# Patient Record
Sex: Male | Born: 1960
Health system: Southern US, Community
[De-identification: ages and names within clinical notes are randomized; demographics above are authoritative.]

## PROBLEM LIST (undated history)

## (undated) DIAGNOSIS — R42 Dizziness and giddiness: Secondary | ICD-10-CM

## (undated) DIAGNOSIS — E119 Type 2 diabetes mellitus without complications: Secondary | ICD-10-CM

## (undated) DIAGNOSIS — E785 Hyperlipidemia, unspecified: Secondary | ICD-10-CM

## (undated) DIAGNOSIS — I10 Essential (primary) hypertension: Secondary | ICD-10-CM

## (undated) HISTORY — DX: Hyperlipidemia, unspecified: E78.5

## (undated) HISTORY — DX: Dizziness and giddiness: R42

## (undated) HISTORY — PX: VASECTOMY: SHX75

## (undated) HISTORY — DX: Type 2 diabetes mellitus without complications: E11.9

## (undated) HISTORY — DX: Essential (primary) hypertension: I10

## (undated) HISTORY — PX: CYST REMOVAL LEG: SHX6280

## (undated) HISTORY — PX: COLONOSCOPY: SHX174

---

## 2008-10-11 ENCOUNTER — Emergency Department: Payer: Self-pay | Admitting: Emergency Medicine

## 2010-02-01 ENCOUNTER — Emergency Department: Payer: Self-pay | Admitting: Internal Medicine

## 2010-04-08 ENCOUNTER — Ambulatory Visit: Payer: Self-pay | Admitting: Gastroenterology

## 2013-12-01 ENCOUNTER — Emergency Department: Payer: Self-pay | Admitting: Emergency Medicine

## 2013-12-01 LAB — CBC WITH DIFFERENTIAL/PLATELET
BASOS PCT: 0.7 %
Basophil #: 0 10*3/uL (ref 0.0–0.1)
Eosinophil #: 0.1 10*3/uL (ref 0.0–0.7)
Eosinophil %: 2.1 %
HCT: 39.8 % — AB (ref 40.0–52.0)
HGB: 13.3 g/dL (ref 13.0–18.0)
Lymphocyte #: 1.9 10*3/uL (ref 1.0–3.6)
Lymphocyte %: 35 %
MCH: 30.3 pg (ref 26.0–34.0)
MCHC: 33.4 g/dL (ref 32.0–36.0)
MCV: 91 fL (ref 80–100)
MONO ABS: 0.3 x10 3/mm (ref 0.2–1.0)
Monocyte %: 6.1 %
Neutrophil #: 3.1 10*3/uL (ref 1.4–6.5)
Neutrophil %: 56.1 %
PLATELETS: 212 10*3/uL (ref 150–440)
RBC: 4.39 10*6/uL — ABNORMAL LOW (ref 4.40–5.90)
RDW: 14.3 % (ref 11.5–14.5)
WBC: 5.6 10*3/uL (ref 3.8–10.6)

## 2013-12-01 LAB — TROPONIN I: Troponin-I: <0.02 ng/mL

## 2013-12-01 LAB — URINALYSIS, COMPLETE
BACTERIA: NONE SEEN
BILIRUBIN, UR: NEGATIVE
Blood: NEGATIVE
Glucose,UR: NEGATIVE mg/dL (ref 0–75)
KETONE: NEGATIVE
Leukocyte Esterase: NEGATIVE
Nitrite: NEGATIVE
PROTEIN: NEGATIVE
Ph: 5 (ref 4.5–8.0)
RBC, UR: NONE SEEN /HPF (ref 0–5)
SQUAMOUS EPITHELIAL: NONE SEEN
Specific Gravity: 1.018 (ref 1.003–1.030)

## 2013-12-01 LAB — BASIC METABOLIC PANEL
ANION GAP: 5 — AB (ref 7–16)
BUN: 12 mg/dL (ref 7–18)
CHLORIDE: 107 mmol/L (ref 98–107)
CREATININE: 1.32 mg/dL — AB (ref 0.60–1.30)
Calcium, Total: 9 mg/dL (ref 8.5–10.1)
Co2: 29 mmol/L (ref 21–32)
EGFR (Non-African Amer.): >60
Glucose: 103 mg/dL — ABNORMAL HIGH (ref 65–99)
OSMOLALITY: 281 (ref 275–301)
Potassium: 4.7 mmol/L (ref 3.5–5.1)
SODIUM: 141 mmol/L (ref 136–145)

## 2015-11-12 DIAGNOSIS — G4733 Obstructive sleep apnea (adult) (pediatric): Secondary | ICD-10-CM | POA: Diagnosis not present

## 2016-01-11 DIAGNOSIS — K219 Gastro-esophageal reflux disease without esophagitis: Secondary | ICD-10-CM | POA: Diagnosis not present

## 2016-01-11 DIAGNOSIS — G4733 Obstructive sleep apnea (adult) (pediatric): Secondary | ICD-10-CM | POA: Diagnosis not present

## 2016-01-11 DIAGNOSIS — J301 Allergic rhinitis due to pollen: Secondary | ICD-10-CM | POA: Diagnosis not present

## 2017-07-10 DIAGNOSIS — H5203 Hypermetropia, bilateral: Secondary | ICD-10-CM | POA: Diagnosis not present

## 2017-07-10 DIAGNOSIS — H52223 Regular astigmatism, bilateral: Secondary | ICD-10-CM | POA: Diagnosis not present

## 2017-07-10 DIAGNOSIS — E119 Type 2 diabetes mellitus without complications: Secondary | ICD-10-CM | POA: Diagnosis not present

## 2017-07-10 DIAGNOSIS — H524 Presbyopia: Secondary | ICD-10-CM | POA: Diagnosis not present

## 2017-09-02 DIAGNOSIS — J40 Bronchitis, not specified as acute or chronic: Secondary | ICD-10-CM | POA: Diagnosis not present

## 2017-12-11 ENCOUNTER — Encounter: Payer: Self-pay | Admitting: Family Medicine

## 2017-12-11 ENCOUNTER — Encounter: Payer: 59 | Admitting: Family Medicine

## 2017-12-12 ENCOUNTER — Encounter: Payer: Self-pay | Admitting: Family Medicine

## 2017-12-12 ENCOUNTER — Ambulatory Visit (INDEPENDENT_AMBULATORY_CARE_PROVIDER_SITE_OTHER): Payer: 59 | Admitting: Family Medicine

## 2017-12-12 VITALS — BP 136/82 | HR 71 | Ht 72.0 in | Wt 224.0 lb

## 2017-12-12 DIAGNOSIS — Z125 Encounter for screening for malignant neoplasm of prostate: Secondary | ICD-10-CM | POA: Diagnosis not present

## 2017-12-12 DIAGNOSIS — Z1159 Encounter for screening for other viral diseases: Secondary | ICD-10-CM | POA: Diagnosis not present

## 2017-12-12 DIAGNOSIS — Z114 Encounter for screening for human immunodeficiency virus [HIV]: Secondary | ICD-10-CM

## 2017-12-12 DIAGNOSIS — Z131 Encounter for screening for diabetes mellitus: Secondary | ICD-10-CM | POA: Diagnosis not present

## 2017-12-12 DIAGNOSIS — Z1329 Encounter for screening for other suspected endocrine disorder: Secondary | ICD-10-CM | POA: Diagnosis not present

## 2017-12-12 DIAGNOSIS — R7309 Other abnormal glucose: Secondary | ICD-10-CM

## 2017-12-12 DIAGNOSIS — Z1211 Encounter for screening for malignant neoplasm of colon: Secondary | ICD-10-CM | POA: Diagnosis not present

## 2017-12-12 LAB — URINALYSIS, ROUTINE W REFLEX MICROSCOPIC
Bilirubin, UA: NEGATIVE
Glucose, UA: NEGATIVE
Ketones, UA: NEGATIVE
Leukocytes, UA: NEGATIVE
NITRITE UA: NEGATIVE
PH UA: 6 (ref 5.0–7.5)
Protein, UA: NEGATIVE
RBC UA: NEGATIVE
SPEC GRAV UA: 1.015 (ref 1.005–1.030)
UUROB: 0.2 mg/dL (ref 0.2–1.0)

## 2017-12-12 NOTE — Progress Notes (Signed)
BP 136/82 (BP Location: Left Arm)   Pulse 71   Ht 6' (1.829 m)   Wt 224 lb (101.6 kg)   SpO2 99%   BMI 30.38 kg/m    Subjective:    Patient ID: Richard Rodgers, male    DOB: 1961/07/21, 57 y.o.   MRN: [id number]  HPI: Richard Rodgers is a 57 y.o. male  Chief Complaint  Patient presents with  . Annual Exam  Patient for physical exam no complaints has history of diabetes hypertension and cholesterol patient's been able to do better with lifestyle loss and nutrition with resolution of those problems. No complaints currently.  With good blood pressure.  Relevant past medical, surgical, family and social history reviewed and updated as indicated. Interim medical history since our last visit reviewed. Allergies and medications reviewed and updated.  Review of Systems  Constitutional: Negative.   HENT: Negative.   Eyes: Negative.   Respiratory: Negative.   Cardiovascular: Negative.   Gastrointestinal: Negative.   Endocrine: Negative.   Genitourinary: Negative.   Musculoskeletal: Negative.   Skin: Negative.   Allergic/Immunologic: Negative.   Neurological: Negative.   Hematological: Negative.   Psychiatric/Behavioral: Negative.     Per HPI unless specifically indicated above     Objective:    BP 136/82 (BP Location: Left Arm)   Pulse 71   Ht 6' (1.829 m)   Wt 224 lb (101.6 kg)   SpO2 99%   BMI 30.38 kg/m   Wt Readings from Last 3 Encounters:  12/12/17 224 lb (101.6 kg)    Physical Exam  Constitutional: He is oriented to person, place, and time. He appears well-developed and well-nourished.  HENT:  Head: Normocephalic and atraumatic.  Right Ear: External ear normal.  Left Ear: External ear normal.  Eyes: Pupils are equal, round, and reactive to light. Conjunctivae and EOM are normal.  Neck: Normal range of motion. Neck supple.  Cardiovascular: Normal rate, regular rhythm, normal heart sounds and intact distal pulses.  Pulmonary/Chest: Effort normal and  breath sounds normal.  Abdominal: Soft. Bowel sounds are normal. There is no splenomegaly or hepatomegaly.  Genitourinary: Rectum normal, prostate normal and penis normal.  Musculoskeletal: Normal range of motion.  Neurological: He is alert and oriented to person, place, and time. He has normal reflexes.  Skin: No rash noted. No erythema.  Psychiatric: He has a normal mood and affect. His behavior is normal. Judgment and thought content normal.    No results found for this or any previous visit.    Assessment & Plan:   Problem List Items Addressed This Visit    None    Visit Diagnoses    Screening for diabetes mellitus (DM)    -  Primary   Relevant Orders   CBC with Differential/Platelet   Comprehensive metabolic panel   Lipid panel   Urinalysis, Routine w reflex microscopic   Encounter for screening for HIV       Relevant Orders   HIV antibody   Need for hepatitis C screening test       Relevant Orders   Hepatitis C antibody   Colon cancer screening       Relevant Orders   Ambulatory referral to Gastroenterology   Thyroid disorder screen       Relevant Orders   TSH   Prostate cancer screening       Relevant Orders   PSA   Elevated glucose       Relevant Orders   Hgb  A1c w/o eAG       Follow up plan: Return in about 1 year (around 12/13/2018).

## 2017-12-13 ENCOUNTER — Telehealth: Payer: Self-pay | Admitting: Family Medicine

## 2017-12-13 LAB — CBC WITH DIFFERENTIAL/PLATELET
Basophils Absolute: 0 10*3/uL (ref 0.0–0.2)
Basos: 0 %
EOS (ABSOLUTE): 0.1 10*3/uL (ref 0.0–0.4)
Eos: 2 %
HEMOGLOBIN: 12.9 g/dL — AB (ref 13.0–17.7)
Hematocrit: 38.9 % (ref 37.5–51.0)
IMMATURE GRANS (ABS): 0 10*3/uL (ref 0.0–0.1)
Immature Granulocytes: 0 %
LYMPHS: 32 %
Lymphocytes Absolute: 1.3 10*3/uL (ref 0.7–3.1)
MCH: 30.9 pg (ref 26.6–33.0)
MCHC: 33.2 g/dL (ref 31.5–35.7)
MCV: 93 fL (ref 79–97)
MONOCYTES: 7 %
Monocytes Absolute: 0.3 10*3/uL (ref 0.1–0.9)
Neutrophils Absolute: 2.3 10*3/uL (ref 1.4–7.0)
Neutrophils: 59 %
Platelets: 211 10*3/uL (ref 150–379)
RBC: 4.18 x10E6/uL (ref 4.14–5.80)
RDW: 15.4 % (ref 12.3–15.4)
WBC: 3.9 10*3/uL (ref 3.4–10.8)

## 2017-12-13 LAB — COMPREHENSIVE METABOLIC PANEL
A/G RATIO: 1.4 (ref 1.2–2.2)
ALT: 31 IU/L (ref 0–44)
AST: 36 IU/L (ref 0–40)
Albumin: 4.3 g/dL (ref 3.5–5.5)
Alkaline Phosphatase: 85 IU/L (ref 39–117)
BUN/Creatinine Ratio: 8 — ABNORMAL LOW (ref 9–20)
BUN: 11 mg/dL (ref 6–24)
Bilirubin Total: 0.3 mg/dL (ref 0.0–1.2)
CALCIUM: 9.5 mg/dL (ref 8.7–10.2)
CO2: 24 mmol/L (ref 20–29)
Chloride: 100 mmol/L (ref 96–106)
Creatinine, Ser: 1.43 mg/dL — ABNORMAL HIGH (ref 0.76–1.27)
GFR, EST AFRICAN AMERICAN: 62 mL/min/{1.73_m2} (ref >59)
GFR, EST NON AFRICAN AMERICAN: 54 mL/min/{1.73_m2} — AB (ref >59)
Globulin, Total: 3 g/dL (ref 1.5–4.5)
Glucose: 114 mg/dL — ABNORMAL HIGH (ref 65–99)
Potassium: 4 mmol/L (ref 3.5–5.2)
Sodium: 140 mmol/L (ref 134–144)
TOTAL PROTEIN: 7.3 g/dL (ref 6.0–8.5)

## 2017-12-13 LAB — HGB A1C W/O EAG: HEMOGLOBIN A1C: 6 % — AB (ref 4.8–5.6)

## 2017-12-13 LAB — PSA: PROSTATE SPECIFIC AG, SERUM: 0.5 ng/mL (ref 0.0–4.0)

## 2017-12-13 LAB — LIPID PANEL
CHOL/HDL RATIO: 6.9 ratio — AB (ref 0.0–5.0)
Cholesterol, Total: 227 mg/dL — ABNORMAL HIGH (ref 100–199)
HDL: 33 mg/dL — AB (ref >39)
LDL Calculated: 157 mg/dL — ABNORMAL HIGH (ref 0–99)
Triglycerides: 183 mg/dL — ABNORMAL HIGH (ref 0–149)
VLDL CHOLESTEROL CAL: 37 mg/dL (ref 5–40)

## 2017-12-13 LAB — TSH: TSH: 3.17 u[IU]/mL (ref 0.450–4.500)

## 2017-12-13 LAB — HEPATITIS C ANTIBODY: Hep C Virus Ab: <0.1 s/co ratio (ref 0.0–0.9)

## 2017-12-13 LAB — HIV ANTIBODY (ROUTINE TESTING W REFLEX): HIV Screen 4th Generation wRfx: NONREACTIVE

## 2017-12-13 NOTE — Telephone Encounter (Signed)
Phone call Discussed with patient slight decline in hemoglobin slight decline in renal function and markedly elevated cholesterol. Patient will come back in a couple months after doing better with diet nutrition weight loss to recheck CBC, BMP, Lipids

## 2017-12-13 NOTE — Telephone Encounter (Signed)
-----   Message from Amada Kingfisher, [place] sent at 12/13/2017 12:24 PM EDT ----- Patient was transferred to provider for telephone conversation.

## 2017-12-19 ENCOUNTER — Telehealth: Payer: Self-pay | Admitting: Gastroenterology

## 2017-12-19 NOTE — Telephone Encounter (Signed)
Gastroenterology Pre-Procedure Review  Request Date: 01/07/18 Requesting Physician: Dr. Bonna Gains  PATIENT REVIEW QUESTIONS: The patient responded to the following health history questions as indicated:    1. Are you having any GI issues? no 2. Do you have a personal history of Polyps? no 3. Do you have a family history of Colon Cancer or Polyps? no 4. Diabetes Mellitus? yes (type 2) 5. Joint replacements in the past 12 months?no 6. Major health problems in the past 3 months?no 7. Any artificial heart valves, MVP, or defibrillator?no    MEDICATIONS & ALLERGIES:    Patient reports the following regarding taking any anticoagulation/antiplatelet therapy:   Plavix, Coumadin, Eliquis, Xarelto, Lovenox, Pradaxa, Brilinta, or Effient? No Aspirin? yes (81 mg )  Patient confirms/reports the following medications:  No current outpatient medications on file.   No current facility-administered medications for this visit.     Patient confirms/reports the following allergies:  Not on File  No orders of the defined types were placed in this encounter.   AUTHORIZATION INFORMATION Primary Insurance: 1D#: Group #:  Secondary Insurance: 1D#: Group #:  SCHEDULE INFORMATION: Date:  Time: Location:

## 2017-12-19 NOTE — Telephone Encounter (Signed)
Patient LVM that he is returning a call to schedule a colonoscopy

## 2017-12-20 ENCOUNTER — Other Ambulatory Visit: Payer: Self-pay

## 2017-12-20 DIAGNOSIS — Z1211 Encounter for screening for malignant neoplasm of colon: Secondary | ICD-10-CM

## 2017-12-20 MED ORDER — PEG 3350-KCL-NABCB-NACL-NASULF 236 G PO SOLR: 4000.0000 mL | Freq: Once | ORAL | 0 refills | Status: AC

## 2018-01-07 ENCOUNTER — Ambulatory Visit
Admission: RE | Admit: 2018-01-07 | Discharge: 2018-01-07 | Disposition: A | Discharge status: Home / Self Care | Payer: 59 | Source: Ambulatory Visit | Attending: Gastroenterology | Admitting: Gastroenterology

## 2018-01-07 ENCOUNTER — Ambulatory Visit: Payer: 59 | Admitting: Certified Registered Nurse Anesthetist

## 2018-01-07 ENCOUNTER — Encounter: Payer: Self-pay | Admitting: *Deleted

## 2018-01-07 ENCOUNTER — Other Ambulatory Visit: Payer: Self-pay

## 2018-01-07 ENCOUNTER — Encounter
Admission: RE | Disposition: A | Discharge status: Home / Self Care | Payer: Self-pay | Source: Ambulatory Visit | Attending: Gastroenterology

## 2018-01-07 DIAGNOSIS — Z1211 Encounter for screening for malignant neoplasm of colon: Secondary | ICD-10-CM

## 2018-01-07 DIAGNOSIS — E785 Hyperlipidemia, unspecified: Secondary | ICD-10-CM | POA: Diagnosis not present

## 2018-01-07 DIAGNOSIS — E119 Type 2 diabetes mellitus without complications: Secondary | ICD-10-CM | POA: Insufficient documentation

## 2018-01-07 DIAGNOSIS — I10 Essential (primary) hypertension: Secondary | ICD-10-CM | POA: Diagnosis not present

## 2018-01-07 DIAGNOSIS — D125 Benign neoplasm of sigmoid colon: Secondary | ICD-10-CM | POA: Diagnosis not present

## 2018-01-07 DIAGNOSIS — K635 Polyp of colon: Secondary | ICD-10-CM | POA: Diagnosis not present

## 2018-01-07 HISTORY — PX: COLONOSCOPY WITH PROPOFOL: SHX5780

## 2018-01-07 LAB — GLUCOSE, CAPILLARY: GLUCOSE-CAPILLARY: 85 mg/dL (ref 65–99)

## 2018-01-07 SURGERY — COLONOSCOPY WITH PROPOFOL
Anesthesia: General

## 2018-01-07 MED ORDER — LIDOCAINE HCL (CARDIAC) PF 100 MG/5ML IV SOSY
PREFILLED_SYRINGE | INTRAVENOUS | Status: DC | PRN
  Administered 2018-01-07: 40 mg via INTRAVENOUS

## 2018-01-07 MED ORDER — SODIUM CHLORIDE 0.9 % IV SOLN
INTRAVENOUS | Status: DC
  Administered 2018-01-07: 12:00:00 via INTRAVENOUS

## 2018-01-07 MED ORDER — LIDOCAINE HCL (PF) 2 % IJ SOLN
INTRAMUSCULAR | Status: AC
  Filled 2018-01-07: qty 10

## 2018-01-07 MED ORDER — PROPOFOL 500 MG/50ML IV EMUL
INTRAVENOUS | Status: DC | PRN
  Administered 2018-01-07: 140 ug/kg/min via INTRAVENOUS

## 2018-01-07 MED ORDER — PROPOFOL 500 MG/50ML IV EMUL
INTRAVENOUS | Status: AC
  Filled 2018-01-07: qty 50

## 2018-01-07 MED ORDER — PROPOFOL 10 MG/ML IV BOLUS
INTRAVENOUS | Status: DC | PRN
  Administered 2018-01-07: 25 mg via INTRAVENOUS
  Administered 2018-01-07: 100 mg via INTRAVENOUS

## 2018-01-07 NOTE — Op Note (Signed)
[facility] Gastroenterology Patient Name: Richard Rodgers Procedure Date: 01/07/2018 12:44 PM MRN: [id number] Account #: [id number] Date of Birth: July 06, 1961 Admit Type: Outpatient Age: 57 Room: [facility] ENDO ROOM 4 Gender: Male Note Status: Finalized Procedure:            Colonoscopy Indications:          Screening for colorectal malignant neoplasm Providers:            Ygnacio Fecteau B. Bonna Gains MD, MD Referring MD:         Guadalupe Maple, MD (Referring MD) Medicines:            Monitored Anesthesia Care Complications:        No immediate complications. Procedure:            Pre-Anesthesia Assessment:                       - ASA Grade Assessment: II - A patient with mild                        systemic disease.                       - Prior to the procedure, a History and Physical was                        performed, and patient medications, allergies and                        sensitivities were reviewed. The patient's tolerance of                        previous anesthesia was reviewed.                       - The risks and benefits of the procedure and the                        sedation options and risks were discussed with the                        patient. All questions were answered and informed                        consent was obtained.                       - Patient identification and proposed procedure were                        verified prior to the procedure by the physician, the                        nurse, the anesthesiologist, the anesthetist and the                        technician. The procedure was verified in the procedure                        room.  After obtaining informed consent, the colonoscope was                        passed under direct vision. Throughout the procedure,                        the patient's blood pressure, pulse, and oxygen                        saturations were monitored continuously. The                     Colonoscope was introduced through the anus and                        advanced to the the cecum, identified by appendiceal                        orifice and ileocecal valve. The colonoscopy was                        performed with ease. The patient tolerated the                        procedure well. The quality of the bowel preparation                        was good. Findings:      The perianal and digital rectal examinations were normal.      A 4 mm polyp was found in the sigmoid colon. The polyp was sessile. The       polyp was removed with a cold snare. Resection and retrieval were       complete.      The exam was otherwise without abnormality.      The rectum, sigmoid colon, descending colon, transverse colon, ascending       colon and cecum appeared normal.      The retroflexed view of the distal rectum and anal verge was normal and       showed no anal or rectal abnormalities. Impression:           - One 4 mm polyp in the sigmoid colon, removed with a                        cold snare. Resected and retrieved.                       - The examination was otherwise normal.                       - The rectum, sigmoid colon, descending colon,                        transverse colon, ascending colon and cecum are normal.                       - The distal rectum and anal verge are normal on                        retroflexion view. Recommendation:       - Discharge patient to  home (with escort).                       - Advance diet as tolerated.                       - Continue present medications.                       - Await pathology results.                       - The findings and recommendations were discussed with                        the patient.                       - The findings and recommendations were discussed with                        the patient's family.                       - Return to primary care physician as previously                         scheduled.                       - If the pathology report reveals adenomatous tissue,                        then repeat the colonoscopy for surveillance in 5 years.                       - If the pathology report indicates hyperplastic polyp,                        then repeat colonoscopy for screening purposes in 10                        years. Procedure Code(s):    --- Professional ---                       [phone], Colonoscopy, flexible; with removal of tumor(s),                        polyp(s), or other lesion(s) by snare technique Diagnosis Code(s):    --- Professional ---                       Z12.11, Encounter for screening for malignant neoplasm                        of colon                       D12.5, Benign neoplasm of sigmoid colon CPT copyright 2017 American Medical Association. All rights reserved. The codes documented in this report are preliminary and upon coder review may  be revised to meet current compliance requirements.  Vonda Antigua, MD Margretta Sidle B. Bonna Gains MD, MD 01/07/2018 1:32:05 PM This report has been signed electronically.  Number of Addenda: 0 Note Initiated On: 01/07/2018 12:44 PM Scope Withdrawal Time: 0 hours 17 minutes 55 seconds  Total Procedure Duration: 0 hours 26 minutes 39 seconds  Estimated Blood Loss: Estimated blood loss: none.      [facility]

## 2018-01-07 NOTE — Anesthesia Post-op Follow-up Note (Signed)
Anesthesia QCDR form completed.        

## 2018-01-07 NOTE — Anesthesia Preprocedure Evaluation (Signed)
Anesthesia Evaluation  Patient identified by MRN, date of birth, ID band Patient awake    Reviewed: Allergy & Precautions, H&P , NPO status , Patient's Chart, lab work & pertinent test results, reviewed documented beta blocker date and time   Airway Mallampati: II   Neck ROM: full    Dental  (+) Teeth Intact   Pulmonary neg pulmonary ROS,    Pulmonary exam normal        Cardiovascular hypertension, negative cardio ROS Normal cardiovascular exam Rhythm:regular Rate:Normal     Neuro/Psych negative neurological ROS  negative psych ROS   GI/Hepatic negative GI ROS, Neg liver ROS,   Endo/Other  negative endocrine ROSdiabetes  Renal/GU negative Renal ROS  negative genitourinary   Musculoskeletal   Abdominal   Peds  Hematology negative hematology ROS (+)   Anesthesia Other Findings Past Medical History: No date: DM (diabetes mellitus) ([place]) No date: HTN (hypertension) No date: Hyperlipidemia No date: Vertigo Past Surgical History: No date: COLONOSCOPY No date: CYST REMOVAL LEG; Left     Comment:  Knee No date: VASECTOMY BMI    Body Mass Index:  28.76 kg/m     Reproductive/Obstetrics negative OB ROS                             Anesthesia Physical Anesthesia Plan  ASA: II  Anesthesia Plan: General   Post-op Pain Management:    Induction:   PONV Risk Score and Plan:   Airway Management Planned:   Additional Equipment:   Intra-op Plan:   Post-operative Plan:   Informed Consent: I have reviewed the patients History and Physical, chart, labs and discussed the procedure including the risks, benefits and alternatives for the proposed anesthesia with the patient or authorized representative who has indicated his/her understanding and acceptance.   Dental Advisory Given  Plan Discussed with: CRNA  Anesthesia Plan Comments:         Anesthesia Quick Evaluation

## 2018-01-07 NOTE — H&P (Signed)
Vonda Antigua, MD [address] [place], [place], [place], [id number], [place], [place], [place], [address] Phone: [phone]  Fax: [phone]  Primary Care Physician:  Guadalupe Maple, MD   Pre-Procedure History & Physical: HPI:  SEVIN LANGENBACH is a 57 y.o. male is here for a colonoscopy.   Past Medical History:  Diagnosis Date  . DM (diabetes mellitus) ([place])   . HTN (hypertension)   . Hyperlipidemia   . Vertigo     Past Surgical History:  Procedure Laterality Date  . COLONOSCOPY    . CYST REMOVAL LEG Left    Knee  . VASECTOMY      Prior to Admission medications   Not on File    Allergies as of 12/20/2017  . (Not on File)    Family History  Problem Relation Age of Onset  . Diabetes Mother   . Hypertension Mother   . Stroke Father   . Lupus Sister   . Diabetes Brother   . Diabetes Maternal Grandmother     Social History   Socioeconomic History  . Marital status: Married    Spouse name: Not on file  . Number of children: Not on file  . Years of education: Not on file  . Highest education level: Not on file  Occupational History  . Not on file  Social Needs  . Financial resource strain: Not on file  . Food insecurity:    Worry: Not on file    Inability: Not on file  . Transportation needs:    Medical: Not on file    Non-medical: Not on file  Tobacco Use  . Smoking status: Never Smoker  . Smokeless tobacco: Never Used  Substance and Sexual Activity  . Alcohol use: Never    Frequency: Never  . Drug use: Never  . Sexual activity: Yes  Lifestyle  . Physical activity:    Days per week: Not on file    Minutes per session: Not on file  . Stress: Not on file  Relationships  . Social connections:    Talks on phone: Not on file    Gets together: Not on file    Attends religious service: Not on file    Active member of club or organization: Not on file    Attends meetings of clubs or organizations: Not on file   Relationship status: Not on file  . Intimate partner violence:    Fear of current or ex partner: Not on file    Emotionally abused: Not on file    Physically abused: Not on file    Forced sexual activity: Not on file  Other Topics Concern  . Not on file  Social History Narrative  . Not on file    Review of Systems: See HPI, otherwise negative ROS  Physical Exam: BP (!) 146/85   Pulse 61   Temp (!) 97.5 F (36.4 C) (Tympanic)   Resp 18   Ht 6\' 1"  (1.854 m)   Wt 218 lb (98.9 kg)   SpO2 99%   BMI 28.76 kg/m  General:   Alert,  pleasant and cooperative in NAD Head:  Normocephalic and atraumatic. Neck:  Supple; no masses or thyromegaly. Lungs:  Clear throughout to auscultation, normal respiratory effort.    Heart:  +S1, +S2, Regular rate and rhythm, No edema. Abdomen:  Soft, nontender and nondistended. Normal bowel sounds, without guarding, and without rebound.   Neurologic:  Alert and  oriented x4;  grossly  normal neurologically.  Impression/Plan: CASTLE LAMONS is here for a colonoscopy to be performed for average risk screening.  Risks, benefits, limitations, and alternatives regarding  colonoscopy have been reviewed with the patient.  Questions have been answered.  All parties agreeable.   Virgel Manifold, MD  01/07/2018, 12:47 PM

## 2018-01-07 NOTE — Transfer of Care (Signed)
Immediate Anesthesia Transfer of Care Note  Patient: Richard Rodgers  Procedure(s) Performed: COLONOSCOPY WITH PROPOFOL (N/A )  Patient Location: PACU and Endoscopy Unit  Anesthesia Type:General  Level of Consciousness: drowsy  Airway & Oxygen Therapy: Patient Spontanous Breathing  Post-op Assessment: Report given to RN and Post -op Vital signs reviewed and stable  Post vital signs: Reviewed and stable  Last Vitals:  Vitals Value Taken Time  BP    Temp    Pulse 69 01/07/2018  1:33 PM  Resp 13 01/07/2018  1:33 PM  SpO2 98 % 01/07/2018  1:33 PM  Vitals shown include unvalidated device data.  Last Pain:  Vitals:   01/07/18 1212  TempSrc: Tympanic  PainSc: 0-No pain         Complications: No apparent anesthesia complications

## 2018-01-08 LAB — SURGICAL PATHOLOGY

## 2018-01-09 ENCOUNTER — Encounter: Payer: Self-pay | Admitting: Gastroenterology

## 2018-01-10 ENCOUNTER — Encounter: Payer: Self-pay | Admitting: Gastroenterology

## 2018-01-14 NOTE — Anesthesia Postprocedure Evaluation (Signed)
Anesthesia Post Note  Patient: Richard Rodgers  Procedure(s) Performed: COLONOSCOPY WITH PROPOFOL (N/A )  Patient location during evaluation: PACU Anesthesia Type: General Level of consciousness: awake and alert Pain management: pain level controlled Vital Signs Assessment: post-procedure vital signs reviewed and stable Respiratory status: spontaneous breathing, nonlabored ventilation, respiratory function stable and patient connected to nasal cannula oxygen Cardiovascular status: blood pressure returned to baseline and stable Postop Assessment: no apparent nausea or vomiting Anesthetic complications: no     Last Vitals:  Vitals:   01/07/18 1334 01/07/18 1354  BP: 105/71   Pulse:    Resp:  16  Temp: (!) 36.4 C   SpO2:      Last Pain:  Vitals:   01/08/18 0721  TempSrc:   PainSc: 0-No pain                 Molli Barrows

## 2018-02-06 ENCOUNTER — Ambulatory Visit (INDEPENDENT_AMBULATORY_CARE_PROVIDER_SITE_OTHER): Payer: 59 | Admitting: Family Medicine

## 2018-02-06 ENCOUNTER — Encounter: Payer: Self-pay | Admitting: Family Medicine

## 2018-02-06 VITALS — BP 133/82 | HR 64 | Wt 219.0 lb

## 2018-02-06 DIAGNOSIS — E7841 Elevated Lipoprotein(a): Secondary | ICD-10-CM

## 2018-02-06 DIAGNOSIS — R7309 Other abnormal glucose: Secondary | ICD-10-CM

## 2018-02-06 DIAGNOSIS — D649 Anemia, unspecified: Secondary | ICD-10-CM | POA: Diagnosis not present

## 2018-02-06 DIAGNOSIS — E785 Hyperlipidemia, unspecified: Secondary | ICD-10-CM | POA: Insufficient documentation

## 2018-02-06 LAB — LP+ALT+AST PICCOLO, WAIVED
ALT (SGPT) Piccolo, Waived: 36 U/L (ref 10–47)
AST (SGOT) PICCOLO, WAIVED: 31 U/L (ref 11–38)
CHOLESTEROL PICCOLO, WAIVED: 189 mg/dL (ref <200)
Chol/HDL Ratio Piccolo,Waive: 5.7 mg/dL — ABNORMAL HIGH
HDL Chol Piccolo, Waived: 33 mg/dL — ABNORMAL LOW (ref >59)
LDL Chol Calc Piccolo Waived: 101 mg/dL — ABNORMAL HIGH (ref <100)
Triglycerides Piccolo,Waived: 274 mg/dL — ABNORMAL HIGH (ref <150)
VLDL Chol Calc Piccolo,Waive: 55 mg/dL — ABNORMAL HIGH (ref <30)

## 2018-02-06 NOTE — Assessment & Plan Note (Signed)
Discussed cholesterol patient doing dietary control with good results we will continue diet.

## 2018-02-06 NOTE — Progress Notes (Signed)
   BP 133/82   Pulse 64   Wt 219 lb (99.3 kg)   SpO2 97%   BMI 28.89 kg/m    Subjective:    Patient ID: Richard Rodgers, male    DOB: 1961-05-15, 57 y.o.   MRN: [id number]  HPI: [name] is a 57 y.o. male  Chief Complaint  Patient presents with  . Follow-up  . Hypertension  . Hyperlipidemia  Patient for diet control of hypertension hypercholesterol has been doing well eating clean and has lost some weight.  Is actually feeling better and doing well. Chart review labs patient with elevated lipids and A1c.  Slightly low hemoglobin rechecking labs today with cholesterol showing great control.  Relevant past medical, surgical, family and social history reviewed and updated as indicated. Interim medical history since our last visit reviewed. Allergies and medications reviewed and updated.  Review of Systems  Constitutional: Negative.   Respiratory: Negative.   Cardiovascular: Negative.     Per HPI unless specifically indicated above     Objective:    BP 133/82   Pulse 64   Wt 219 lb (99.3 kg)   SpO2 97%   BMI 28.89 kg/m   Wt Readings from Last 3 Encounters:  02/06/18 219 lb (99.3 kg)  01/07/18 218 lb (98.9 kg)  12/12/17 224 lb (101.6 kg)    Physical Exam  Constitutional: He is oriented to person, place, and time. He appears well-developed and well-nourished.  HENT:  Head: Normocephalic and atraumatic.  Eyes: Conjunctivae and EOM are normal.  Neck: Normal range of motion.  Cardiovascular: Normal rate, regular rhythm and normal heart sounds.  Pulmonary/Chest: Effort normal and breath sounds normal.  Musculoskeletal: Normal range of motion.  Neurological: He is alert and oriented to person, place, and time.  Skin: No erythema.  Psychiatric: He has a normal mood and affect. His behavior is normal. Judgment and thought content normal.        Assessment & Plan:   Problem List Items Addressed This Visit      Other   Hyperlipidemia    Discussed  cholesterol patient doing dietary control with good results we will continue diet.       Other Visit Diagnoses    Elevated glucose    -  Primary   Relevant Orders   CBC with Differential/Platelet   Basic metabolic panel   LP+ALT+AST [place], Waived    Elevated glucose with prediabetes now will observe and continue diet. Blood pressure good control.   Follow up plan: Return in about 1 year (around 02/07/2019), or if symptoms worsen or fail to improve, for Physical Exam.

## 2018-02-07 LAB — CBC WITH DIFFERENTIAL/PLATELET
BASOS: 0 %
Basophils Absolute: 0 10*3/uL (ref 0.0–0.2)
EOS (ABSOLUTE): 0.1 10*3/uL (ref 0.0–0.4)
EOS: 3 %
HEMATOCRIT: 37.7 % (ref 37.5–51.0)
Hemoglobin: 12.5 g/dL — ABNORMAL LOW (ref 13.0–17.7)
IMMATURE GRANS (ABS): 0 10*3/uL (ref 0.0–0.1)
Immature Granulocytes: 0 %
Lymphocytes Absolute: 1.7 10*3/uL (ref 0.7–3.1)
Lymphs: 36 %
MCH: 30.6 pg (ref 26.6–33.0)
MCHC: 33.2 g/dL (ref 31.5–35.7)
MCV: 92 fL (ref 79–97)
MONOS ABS: 0.4 10*3/uL (ref 0.1–0.9)
Monocytes: 7 %
NEUTROS ABS: 2.5 10*3/uL (ref 1.4–7.0)
Neutrophils: 54 %
Platelets: 195 10*3/uL (ref 150–450)
RBC: 4.08 x10E6/uL — ABNORMAL LOW (ref 4.14–5.80)
RDW: 13.7 % (ref 12.3–15.4)
WBC: 4.7 10*3/uL (ref 3.4–10.8)

## 2018-02-07 LAB — BASIC METABOLIC PANEL
BUN / CREAT RATIO: 10 (ref 9–20)
BUN: 13 mg/dL (ref 6–24)
CALCIUM: 9.1 mg/dL (ref 8.7–10.2)
CO2: 25 mmol/L (ref 20–29)
Chloride: 104 mmol/L (ref 96–106)
Creatinine, Ser: 1.3 mg/dL — ABNORMAL HIGH (ref 0.76–1.27)
GFR, EST AFRICAN AMERICAN: 70 mL/min/{1.73_m2} (ref >59)
GFR, EST NON AFRICAN AMERICAN: 60 mL/min/{1.73_m2} (ref >59)
Glucose: 114 mg/dL — ABNORMAL HIGH (ref 65–99)
Potassium: 4.3 mmol/L (ref 3.5–5.2)
Sodium: 141 mmol/L (ref 134–144)

## 2018-02-12 ENCOUNTER — Telehealth: Payer: Self-pay | Admitting: Family Medicine

## 2018-02-12 DIAGNOSIS — D649 Anemia, unspecified: Secondary | ICD-10-CM

## 2018-02-12 NOTE — Telephone Encounter (Signed)
-----   Message from Amada Kingfisher, [place] sent at 02/12/2018 11:27 AM EDT ----- Patient was transferred to provider for telephone conversation.

## 2018-02-12 NOTE — Telephone Encounter (Signed)
Phone call Discussed with patient very slight anemia because of this will check iron studies and B12.

## 2018-02-13 ENCOUNTER — Telehealth: Payer: Self-pay | Admitting: Family Medicine

## 2018-02-13 DIAGNOSIS — D649 Anemia, unspecified: Secondary | ICD-10-CM

## 2018-02-13 NOTE — Telephone Encounter (Signed)
Phone call Discussed with patient very mild tendency towards anemia with very mild iron changes will refer to GI to further evaluate.

## 2018-02-13 NOTE — Telephone Encounter (Signed)
-----   Message from Amada Kingfisher, [place] sent at 02/13/2018 12:08 PM EDT ----- Patient was transferred to provider for telephone conversation.

## 2018-03-06 LAB — IRON AND TIBC
Iron Saturation: 20 % (ref 15–55)
Iron: 59 ug/dL (ref 38–169)
TIBC: 299 ug/dL (ref 250–450)
UIBC: 240 ug/dL (ref 111–343)

## 2018-03-06 LAB — VITAMIN B12: Vitamin B-12: 514 pg/mL (ref 232–1245)

## 2018-03-06 LAB — FERRITIN: Ferritin: 216 ng/mL (ref 30–400)

## 2018-03-06 LAB — FOLATE: FOLATE: 8.4 ng/mL (ref >3.0)

## 2018-03-06 LAB — SPECIMEN STATUS REPORT

## 2018-04-10 ENCOUNTER — Ambulatory Visit (INDEPENDENT_AMBULATORY_CARE_PROVIDER_SITE_OTHER): Payer: 59 | Admitting: Gastroenterology

## 2018-04-10 ENCOUNTER — Encounter: Payer: Self-pay | Admitting: Gastroenterology

## 2018-04-10 VITALS — BP 126/76 | HR 70 | Ht 72.0 in | Wt 224.2 lb

## 2018-04-10 DIAGNOSIS — D649 Anemia, unspecified: Secondary | ICD-10-CM

## 2018-04-10 DIAGNOSIS — R1312 Dysphagia, oropharyngeal phase: Secondary | ICD-10-CM

## 2018-04-10 NOTE — Progress Notes (Signed)
Richard Rodgers  [place], [place] [address]  Main: [phone]  Fax: [phone]   Gastroenterology Consultation  Referring Provider:     Guadalupe Maple, MD Primary Care Physician:  Guadalupe Maple, MD Primary Gastroenterologist:  Dr. Vonda Rodgers Reason for Consultation:     Anemia        HPI:    Chief Complaint  Patient presents with  . New Patient (Initial Visit)    reffered by Dr. Justice Rocher for anemia    Richard MCCLAFFERTY is a 57 y.o. y/o male referred for consultation & management  by Dr. Jeananne Rodgers, Jeannette How, MD.  Patient must found to have a recent hemoglobin of 12.5 on blood work done as an outpatient by primary care provider.  Hemoglobin was 12.9, 68-month prior to that.  Normal MCV in the 90s.  Lower limit of normal on lab work is 13.  Patient denies any melena, hematochezia, abdominal pain, weight loss, heartburn.  Patient had a screening colonoscopy in June 2019 with sigmoid colon polyp removed and showed tubular adenoma, repeat recommended in 5 years.  Patient does report that he has seen ENT before, and states last saw them about a year and a half ago.  Sees Dr. Pryor Ochoa.  States he started seeing them due to voice changes, he describes a gurgly voice, and some associated dysphagia due to this.  He states ENT evaluated him and said his symptoms are due to the way his larynx folds.  He states that they had recommended surgery, but patient opted not to have surgery.  I do not have any of the ENT records to confirm the above.  He states since then, about 4-5 times a month, he has episodes where he has to swallow slowly to allow the food to go down completely.  No episodes of food impaction or problem with having to spit food back up.  No prior EGDs.  Past Medical History:  Diagnosis Date  . DM (diabetes mellitus) ([place])   . HTN (hypertension)   . Hyperlipidemia   . Vertigo     Past Surgical History:  Procedure Laterality Date    . COLONOSCOPY    . COLONOSCOPY WITH PROPOFOL N/A 01/07/2018   Procedure: COLONOSCOPY WITH PROPOFOL;  Surgeon: Virgel Manifold, MD;  Location: [facility] ENDOSCOPY;  Service: Endoscopy;  Laterality: N/A;  . CYST REMOVAL LEG Left    Knee  . VASECTOMY      Prior to Admission medications   Medication Sig Start Date End Date Taking? Authorizing Provider  Flaxseed, Linseed, (FLAXSEED OIL PO) Take by mouth daily.   Yes [provider]  Specialty Vitamins Products (ONE-A-DAY CHOLESTEROL PO) Take by mouth daily.   Yes [provider]    Family History  Problem Relation Age of Onset  . Diabetes Mother   . Hypertension Mother   . Stroke Father   . Lupus Sister   . Diabetes Brother   . Diabetes Maternal Grandmother      Social History   Tobacco Use  . Smoking status: Never Smoker  . Smokeless tobacco: Never Used  Substance Use Topics  . Alcohol use: Never    Frequency: Never  . Drug use: Never    Allergies as of 04/10/2018  . (No Known Allergies)    Review of Systems:    All systems reviewed and negative except where noted in HPI.   Physical Exam:  BP 126/76   Pulse 70  Ht 6' (1.829 m)   Wt 224 lb 3.2 oz (101.7 kg)   BMI 30.41 kg/m  No LMP for male patient. Psych:  Alert and cooperative. Normal mood and affect. General:   Alert,  Well-developed, well-nourished, pleasant and cooperative in NAD Head:  Normocephalic and atraumatic. Eyes:  Sclera clear, no icterus.   Conjunctiva pink. Ears:  Normal auditory acuity. Nose:  No deformity, discharge, or lesions. Mouth:  No deformity or lesions,oropharynx pink & moist. Neck:  Supple; no masses or thyromegaly. Abdomen:  Normal bowel sounds.  No bruits.  Soft, non-tender and non-distended without masses, hepatosplenomegaly or hernias noted.  No guarding or rebound tenderness.    Msk:  Symmetrical without gross deformities. Good, equal movement & strength bilaterally. Pulses:  Normal pulses noted. Extremities:   No clubbing or edema.  No cyanosis. Neurologic:  Alert and oriented x3;  grossly normal neurologically. Skin:  Intact without significant lesions or rashes. No jaundice. Lymph Nodes:  No significant cervical adenopathy. Psych:  Alert and cooperative. Normal mood and affect.   Labs: CBC    Component Value Date/Time   WBC 4.7 02/06/2018 1035   WBC 5.6 12/01/2013 1157   RBC 4.08 (L) 02/06/2018 1035   RBC 4.39 (L) 12/01/2013 1157   HGB 12.5 (L) 02/06/2018 1035   HCT 37.7 02/06/2018 1035   PLT 195 02/06/2018 1035   MCV 92 02/06/2018 1035   MCV 91 12/01/2013 1157   MCH 30.6 02/06/2018 1035   MCH 30.3 12/01/2013 1157   MCHC 33.2 02/06/2018 1035   MCHC 33.4 12/01/2013 1157   RDW 13.7 02/06/2018 1035   RDW 14.3 12/01/2013 1157   LYMPHSABS 1.7 02/06/2018 1035   LYMPHSABS 1.9 12/01/2013 1157   MONOABS 0.3 12/01/2013 1157   EOSABS 0.1 02/06/2018 1035   EOSABS 0.1 12/01/2013 1157   BASOSABS 0.0 02/06/2018 1035   BASOSABS 0.0 12/01/2013 1157   CMP     Component Value Date/Time   NA 141 02/06/2018 1035   NA 141 12/01/2013 1157   K 4.3 02/06/2018 1035   K 4.7 12/01/2013 1157   CL 104 02/06/2018 1035   CL 107 12/01/2013 1157   CO2 25 02/06/2018 1035   CO2 29 12/01/2013 1157   GLUCOSE 114 (H) 02/06/2018 1035   GLUCOSE 103 (H) 12/01/2013 1157   BUN 13 02/06/2018 1035   BUN 12 12/01/2013 1157   CREATININE 1.30 (H) 02/06/2018 1035   CREATININE 1.32 (H) 12/01/2013 1157   CALCIUM 9.1 02/06/2018 1035   CALCIUM 9.0 12/01/2013 1157   PROT 7.3 12/12/2017 0931   ALBUMIN 4.3 12/12/2017 0931   AST 31 02/06/2018 0924   ALT 36 02/06/2018 0924   ALKPHOS 85 12/12/2017 0931   BILITOT 0.3 12/12/2017 0931   GFRNONAA 60 02/06/2018 1035   GFRNONAA >60 12/01/2013 1157   GFRAA 70 02/06/2018 1035   GFRAA >60 12/01/2013 1157    Imaging Studies: No results found.  Assessment and Plan:   ARTIS BEGGS is a 57 y.o. y/o male has been referred for anemia  Patient's anemia is not  microcytic, it is normocytic. Ferritin level is completely normal at 216 He is not on any iron replacement  Therefore, as far as his anemia is concerned, it is mild, and there is no indication for further endoscopy since there is no evidence of iron deficiency anemia or active GI bleeding.  I would recommend Dr. Jeananne Rodgers to consider hematology referral for normocytic anemia   We will try to obtain his  previous ENT records to clarify his diagnosis that he states ENT made about his larynx. He reports having episodes 4-5 times a month where he has to swallow slowly for the food to go down, and states ENT attribute it to this to his larynx anatomy as well As he already has a reason for his symptoms, no indication for EGD at this time In addition, sedation during an EGD can cause complications if in fact he has an abnormal larynx anatomy that ENT had recommended surgery for in the past  Instead, we will obtain esophagram to rule out any underlying lesions in his esophagus Again, this is not to evaluate his anemia, as his anemia is not due to iron deficiency, but just to confirm and rule out any esophageal lesions that could be contributing to his symptoms with swallowing noted above  He understands the above plan and agrees with it  Dr Richard Rodgers  Addendum: ENT notes obtained Patient saw Dr. Pryor Ochoa in February 2017 due to dysphagia, and reports of snoring and gasping for breath during the night. Their exam report inferior turbinate hypertrophy and inferior turbinate edema of the nasal cavity Larynx showed edema and cobblestoning, interarytenoid mucosa, interarytenoid pachydermia Large tongue base  Their impression was allergies, and laryngeal pharyngeal reflux. They ordered a sleep study, and treating him with Protonix for possible laryngeal pharyngeal reflux  Sleep study showed mild sleep apnea and he discussed lifestyle changes, weight loss We also discussed surgery for removal of  tonsils, mandibular advancement device or CPAP.  There is also a letter from Dr. Mahlon Gammon from 1999 but states "he did have a very slight false cord overhang on the left anteriorly and that was only pathology I could see.  If he continues to have persistent problems I would be happy to reevaluate him and we may even consider a direct laryngoscopy to examine this anterior overhang a little better."  We will await patient's barium swallow study

## 2018-04-10 NOTE — Patient Instructions (Signed)
UGI STUDY scheduled for 04/15/18 at [facility]. Check in at the [facility], registration desk.  Time:10:00 am (arrival time 9:45 am) Nothing to eat or drink after midnight the day before procedure.  F/U in 3 months

## 2018-04-15 ENCOUNTER — Telehealth: Payer: Self-pay | Admitting: Gastroenterology

## 2018-04-15 ENCOUNTER — Ambulatory Visit
Admission: RE | Admit: 2018-04-15 | Discharge: 2018-04-15 | Disposition: A | Discharge status: Home / Self Care | Payer: 59 | Source: Ambulatory Visit | Attending: Gastroenterology | Admitting: Gastroenterology

## 2018-04-15 DIAGNOSIS — K219 Gastro-esophageal reflux disease without esophagitis: Secondary | ICD-10-CM | POA: Insufficient documentation

## 2018-04-15 DIAGNOSIS — R1312 Dysphagia, oropharyngeal phase: Secondary | ICD-10-CM | POA: Diagnosis present

## 2018-04-15 NOTE — Telephone Encounter (Signed)
I called this am and they found what they needed.

## 2018-04-15 NOTE — Telephone Encounter (Signed)
[facility] DEPARTMENT left vm regarding pt order please call [phone]

## 2018-04-17 NOTE — Telephone Encounter (Signed)
PT IS RETURNING YOUR CALL.

## 2018-07-09 ENCOUNTER — Ambulatory Visit: Payer: 59 | Admitting: Gastroenterology

## 2018-07-17 DIAGNOSIS — E119 Type 2 diabetes mellitus without complications: Secondary | ICD-10-CM | POA: Diagnosis not present

## 2018-07-17 DIAGNOSIS — H5203 Hypermetropia, bilateral: Secondary | ICD-10-CM | POA: Diagnosis not present

## 2018-08-05 ENCOUNTER — Encounter: Payer: Self-pay | Admitting: Family Medicine

## 2018-09-05 ENCOUNTER — Ambulatory Visit: Payer: 59 | Admitting: Gastroenterology

## 2018-09-19 DIAGNOSIS — S86112A Strain of other muscle(s) and tendon(s) of posterior muscle group at lower leg level, left leg, initial encounter: Secondary | ICD-10-CM | POA: Diagnosis not present

## 2018-09-19 DIAGNOSIS — M25472 Effusion, left ankle: Secondary | ICD-10-CM | POA: Diagnosis not present

## 2018-09-19 DIAGNOSIS — M25471 Effusion, right ankle: Secondary | ICD-10-CM | POA: Diagnosis not present

## 2018-12-17 ENCOUNTER — Encounter: Payer: 59 | Admitting: Family Medicine

## 2019-01-24 ENCOUNTER — Telehealth: Payer: Self-pay | Admitting: Family Medicine

## 2019-01-24 NOTE — Telephone Encounter (Signed)
Called pt to go over script screening no answer left vm

## 2019-01-27 ENCOUNTER — Other Ambulatory Visit: Payer: Self-pay

## 2019-01-27 ENCOUNTER — Encounter: Payer: 59 | Admitting: Family Medicine

## 2019-01-27 ENCOUNTER — Encounter: Payer: Self-pay | Admitting: Nurse Practitioner

## 2019-01-27 ENCOUNTER — Ambulatory Visit (INDEPENDENT_AMBULATORY_CARE_PROVIDER_SITE_OTHER): Payer: 59 | Admitting: Nurse Practitioner

## 2019-01-27 VITALS — BP 128/80 | HR 83 | Temp 97.8°F | Ht 71.4 in | Wt 226.6 lb

## 2019-01-27 DIAGNOSIS — E7841 Elevated Lipoprotein(a): Secondary | ICD-10-CM

## 2019-01-27 DIAGNOSIS — Z0000 Encounter for general adult medical examination without abnormal findings: Secondary | ICD-10-CM | POA: Diagnosis not present

## 2019-01-27 MED ORDER — SHINGRIX 50 MCG/0.5ML IM SUSR: 0.5000 mL | Freq: Once | INTRAMUSCULAR | 0 refills | Status: AC

## 2019-01-27 NOTE — Patient Instructions (Signed)
Fat and Cholesterol Restricted Eating Plan Getting too much fat and cholesterol in your diet may cause health problems. Choosing the right foods helps keep your fat and cholesterol at normal levels. This can keep you from getting certain diseases. Your doctor may recommend an eating plan that includes:  Total fat: ______% or less of total calories a day.  Saturated fat: ______% or less of total calories a day.  Cholesterol: less than _________mg a day.  Fiber: ______g a day. What are tips for following this plan? Meal planning  At meals, divide your plate into four equal parts: ? Fill one-half of your plate with vegetables and green salads. ? Fill one-fourth of your plate with whole grains. ? Fill one-fourth of your plate with low-fat (lean) protein foods.  Eat fish that is high in omega-3 fats at least two times a week. This includes mackerel, tuna, sardines, and salmon.  Eat foods that are high in fiber, such as whole grains, beans, apples, broccoli, carrots, peas, and barley. General tips   Work with your doctor to lose weight if you need to.  Avoid: ? Foods with added sugar. ? Fried foods. ? Foods with partially hydrogenated oils.  Limit alcohol intake to no more than 1 drink a day for nonpregnant women and 2 drinks a day for men. One drink equals 12 oz of beer, 5 oz of wine, or 1 oz of hard liquor. Reading food labels  Check food labels for: ? Trans fats. ? Partially hydrogenated oils. ? Saturated fat (g) in each serving. ? Cholesterol (mg) in each serving. ? Fiber (g) in each serving.  Choose foods with healthy fats, such as: ? Monounsaturated fats. ? Polyunsaturated fats. ? Omega-3 fats.  Choose grain products that have whole grains. Look for the word "whole" as the first word in the ingredient list. Cooking  Cook foods using low-fat methods. These include baking, boiling, grilling, and broiling.  Eat more home-cooked foods. Eat at restaurants and buffets  less often.  Avoid cooking using saturated fats, such as butter, cream, palm oil, palm kernel oil, and coconut oil. Recommended foods  Fruits  All fresh, canned (in natural juice), or frozen fruits. Vegetables  Fresh or frozen vegetables (raw, steamed, roasted, or grilled). Green salads. Grains  Whole grains, such as whole wheat or whole grain breads, crackers, cereals, and pasta. Unsweetened oatmeal, bulgur, barley, quinoa, or brown rice. Corn or whole wheat flour tortillas. Meats and other protein foods  Ground beef (85% or leaner), grass-fed beef, or beef trimmed of fat. Skinless chicken or turkey. Ground chicken or turkey. Pork trimmed of fat. All fish and seafood. Egg whites. Dried beans, peas, or lentils. Unsalted nuts or seeds. Unsalted canned beans. Nut butters without added sugar or oil. Dairy  Low-fat or nonfat dairy products, such as skim or 1% milk, 2% or reduced-fat cheeses, low-fat and fat-free ricotta or cottage cheese, or plain low-fat and nonfat yogurt. Fats and oils  Tub margarine without trans fats. Light or reduced-fat mayonnaise and salad dressings. Avocado. Olive, canola, sesame, or safflower oils. The items listed above may not be a complete list of foods and beverages you can eat. Contact a dietitian for more information. Foods to avoid Fruits  Canned fruit in heavy syrup. Fruit in cream or butter sauce. Fried fruit. Vegetables  Vegetables cooked in cheese, cream, or butter sauce. Fried vegetables. Grains  White bread. White pasta. White rice. Cornbread. Bagels, pastries, and croissants. Crackers and snack foods that contain trans fat   and hydrogenated oils. Meats and other protein foods  Fatty cuts of meat. Ribs, chicken wings, bacon, sausage, bologna, salami, chitterlings, fatback, hot dogs, bratwurst, and packaged lunch meats. Liver and organ meats. Whole eggs and egg yolks. Chicken and turkey with skin. Fried meat. Dairy  Whole or 2% milk, cream,  half-and-half, and cream cheese. Whole milk cheeses. Whole-fat or sweetened yogurt. Full-fat cheeses. Nondairy creamers and whipped toppings. Processed cheese, cheese spreads, and cheese curds. Beverages  Alcohol. Sugar-sweetened drinks such as sodas, lemonade, and fruit drinks. Fats and oils  Butter, stick margarine, lard, shortening, ghee, or bacon fat. Coconut, palm kernel, and palm oils. Sweets and desserts  Corn syrup, sugars, honey, and molasses. Candy. Jam and jelly. Syrup. Sweetened cereals. Cookies, pies, cakes, donuts, muffins, and ice cream. The items listed above may not be a complete list of foods and beverages you should avoid. Contact a dietitian for more information. Summary  Choosing the right foods helps keep your fat and cholesterol at normal levels. This can keep you from getting certain diseases.  At meals, fill one-half of your plate with vegetables and green salads.  Eat high-fiber foods, like whole grains, beans, apples, carrots, peas, and barley.  Limit added sugar, saturated fats, alcohol, and fried foods. This information is not intended to replace advice given to you by your health care provider. Make sure you discuss any questions you have with your health care provider. Document Released: 01/23/2012 Document Revised: 03/27/2018 Document Reviewed: 04/10/2017 Elsevier Interactive Patient Education  2019 [employer]   

## 2019-01-27 NOTE — Assessment & Plan Note (Signed)
Chronic, stable without statin.  Labs today and adjust plan of care as needed.

## 2019-01-27 NOTE — Progress Notes (Signed)
BP 128/80 (BP Location: Left Arm)   Pulse 83   Temp 97.8 F (36.6 C) (Oral)   Ht 5' 11.4" (1.814 m)   Wt 226 lb 9.6 oz (102.8 kg)   SpO2 99%   BMI 31.25 kg/m    Subjective:    Patient ID: Richard Rodgers, male    DOB: 1960-11-17, 58 y.o.   MRN: [id number]  HPI: MANDELA Rodgers is a 58 y.o. male presenting on 01/27/2019 for comprehensive medical examination. Current medical complaints include:none  He currently lives with: wife Interim Problems from his last visit: no  Functional Status Survey: Is the patient deaf or have difficulty hearing?: No Does the patient have difficulty seeing, even when wearing glasses/contacts?: No Does the patient have difficulty concentrating, remembering, or making decisions?: No Does the patient have difficulty walking or climbing stairs?: No Does the patient have difficulty dressing or bathing?: No Does the patient have difficulty doing errands alone such as visiting a doctor's office or shopping?: No  FALL RISK: Fall Risk  01/27/2019 12/12/2017  Falls in the past year? 0 No  Number falls in past yr: 0 -  Injury with Fall? 0 -  Follow up Falls evaluation completed -    Depression Screen Depression screen [facility] 2/9 01/27/2019 12/12/2017  Decreased Interest 0 0  Down, Depressed, Hopeless 0 0  PHQ - 2 Score 0 0  Altered sleeping 0 -  Tired, decreased energy 0 -  Change in appetite 0 -  Feeling bad or failure about yourself  0 -  Trouble concentrating 0 -  Moving slowly or fidgety/restless 0 -  Suicidal thoughts 0 -  PHQ-9 Score 0 -  Difficult doing work/chores Not difficult at all -    Advanced Directives <no information>  Past Medical History:  Past Medical History:  Diagnosis Date  . DM (diabetes mellitus) ([place])   . HTN (hypertension)   . Hyperlipidemia   . Vertigo     Surgical History:  Past Surgical History:  Procedure Laterality Date  . COLONOSCOPY    . COLONOSCOPY WITH PROPOFOL N/A 01/07/2018   Procedure: COLONOSCOPY  WITH PROPOFOL;  Surgeon: Virgel Manifold, MD;  Location: [facility] ENDOSCOPY;  Service: Endoscopy;  Laterality: N/A;  . CYST REMOVAL LEG Left    Knee  . VASECTOMY      Medications:  Current Outpatient Medications on File Prior to Visit  Medication Sig  . folic acid (FOLVITE) 924 MCG tablet Take 400 mcg by mouth daily.  . Misc Natural Products (HORNY GOAT WEED PO) Take 750 mg by mouth daily.  . Multiple Vitamin (MULTIVITAMIN) tablet Take 1 tablet by mouth daily.  . Omega-3 Fatty Acids (FISH OIL) 1000 MG CAPS Take 1,000 mg by mouth daily.  . Plant Sterols and Stanols (CHOLESTOFF PO) Take 900 mcg by mouth 2 (two) times daily.  . Potassium 99 MG TABS Take 1 tablet by mouth daily.  . vitamin B-12 (CYANOCOBALAMIN) 500 MCG tablet Take 500 mcg by mouth daily.   No current facility-administered medications on file prior to visit.     Allergies:  No Known Allergies  Social History:  Social History   Socioeconomic History  . Marital status: Married    Spouse name: Not on file  . Number of children: Not on file  . Years of education: Not on file  . Highest education level: Not on file  Occupational History  . Not on file  Social Needs  . Financial resource strain: Not on file  .  Food insecurity    Worry: Not on file    Inability: Not on file  . Transportation needs    Medical: Not on file    Non-medical: Not on file  Tobacco Use  . Smoking status: Never Smoker  . Smokeless tobacco: Never Used  Substance and Sexual Activity  . Alcohol use: Never    Frequency: Never  . Drug use: Never  . Sexual activity: Yes  Lifestyle  . Physical activity    Days per week: Not on file    Minutes per session: Not on file  . Stress: Not on file  Relationships  . Social Herbalist on phone: Not on file    Gets together: Not on file    Attends religious service: Not on file    Active member of club or organization: Not on file    Attends meetings of clubs or organizations: Not on  file    Relationship status: Not on file  . Intimate partner violence    Fear of current or ex partner: Not on file    Emotionally abused: Not on file    Physically abused: Not on file    Forced sexual activity: Not on file  Other Topics Concern  . Not on file  Social History Narrative  . Not on file   Social History   Tobacco Use  Smoking Status Never Smoker  Smokeless Tobacco Never Used   Social History   Substance and Sexual Activity  Alcohol Use Never  . Frequency: Never    Family History:  Family History  Problem Relation Age of Onset  . Diabetes Mother   . Hypertension Mother   . Stroke Father   . Lupus Sister   . Diabetes Brother   . Diabetes Maternal Grandmother     Past medical history, surgical history, medications, allergies, family history and social history reviewed with patient today and changes made to appropriate areas of the chart.   Review of Systems -  negative All other ROS negative except what is listed above and in the HPI.      Objective:    BP 128/80 (BP Location: Left Arm)   Pulse 83   Temp 97.8 F (36.6 C) (Oral)   Ht 5' 11.4" (1.814 m)   Wt 226 lb 9.6 oz (102.8 kg)   SpO2 99%   BMI 31.25 kg/m   Wt Readings from Last 3 Encounters:  01/27/19 226 lb 9.6 oz (102.8 kg)  04/10/18 224 lb 3.2 oz (101.7 kg)  02/06/18 219 lb (99.3 kg)    Physical Exam Vitals signs and nursing note reviewed. Exam conducted with a chaperone present.  Constitutional:      General: He is not in acute distress.    Appearance: He is well-developed. He is not ill-appearing.  HENT:     Head: Normocephalic and atraumatic.     Right Ear: Hearing, tympanic membrane, ear canal and external ear normal. No drainage.     Left Ear: Hearing, tympanic membrane, ear canal and external ear normal. No drainage.     Mouth/Throat:     Pharynx: Uvula midline.  Eyes:     General: Lids are normal.        Right eye: No discharge.        Left eye: No discharge.      Extraocular Movements: Extraocular movements intact.     Conjunctiva/sclera: Conjunctivae normal.     Pupils: Pupils are equal, round, and reactive to  light.     Visual Fields: Right eye visual fields normal and left eye visual fields normal.  Neck:     Musculoskeletal: Normal range of motion and neck supple.     Thyroid: No thyromegaly.     Vascular: No carotid bruit or JVD.     Trachea: Trachea normal.  Cardiovascular:     Rate and Rhythm: Normal rate and regular rhythm.     Heart sounds: Normal heart sounds, S1 normal and S2 normal. No murmur. No gallop.   Pulmonary:     Effort: Pulmonary effort is normal. No accessory muscle usage or respiratory distress.     Breath sounds: Normal breath sounds.  Abdominal:     General: Bowel sounds are normal.     Palpations: Abdomen is soft. There is no hepatomegaly or splenomegaly.     Hernia: There is no hernia in the left inguinal area or right inguinal area.  Genitourinary:    Penis: Normal.      Scrotum/Testes: Normal.     Epididymis:     Right: Normal.     Left: Normal.     Prostate: Normal.     Rectum: Normal.  Musculoskeletal: Normal range of motion.     Right lower leg: No edema.     Left lower leg: No edema.  Skin:    General: Skin is warm and dry.     Capillary Refill: Capillary refill takes less than 2 seconds.     Findings: No rash.  Neurological:     Mental Status: He is alert and oriented to person, place, and time.     Deep Tendon Reflexes: Reflexes are normal and symmetric.  Psychiatric:        Mood and Affect: Mood normal.        Behavior: Behavior normal.        Thought Content: Thought content normal.        Judgment: Judgment normal.       Results for orders placed or performed in visit on 02/06/18  CBC with Differential/Platelet  Result Value Ref Range   WBC 4.7 3.4 - 10.8 x10E3/uL   RBC 4.08 (L) 4.14 - 5.80 x10E6/uL   Hemoglobin 12.5 (L) 13.0 - 17.7 g/dL   Hematocrit 37.7 37.5 - 51.0 %   MCV 92 79 -  97 fL   MCH 30.6 26.6 - 33.0 pg   MCHC 33.2 31.5 - 35.7 g/dL   RDW 13.7 12.3 - 15.4 %   Platelets 195 150 - 450 x10E3/uL   Neutrophils 54 Not Estab. %   Lymphs 36 Not Estab. %   Monocytes 7 Not Estab. %   Eos 3 Not Estab. %   Basos 0 Not Estab. %   Neutrophils Absolute 2.5 1.4 - 7.0 x10E3/uL   Lymphocytes Absolute 1.7 0.7 - 3.1 x10E3/uL   Monocytes Absolute 0.4 0.1 - 0.9 x10E3/uL   EOS (ABSOLUTE) 0.1 0.0 - 0.4 x10E3/uL   Basophils Absolute 0.0 0.0 - 0.2 x10E3/uL   Immature Granulocytes 0 Not Estab. %   Immature Grans (Abs) 0.0 0.0 - 0.1 H06C3/JS  Basic metabolic panel  Result Value Ref Range   Glucose 114 (H) 65 - 99 mg/dL   BUN 13 6 - 24 mg/dL   Creatinine, Ser 1.30 (H) 0.76 - 1.27 mg/dL   GFR calc non Af Amer 60 >59 mL/min/1.73   GFR calc Af Amer 70 >59 mL/min/1.73   BUN/Creatinine Ratio 10 9 - 20   Sodium 141 134 -  144 mmol/L   Potassium 4.3 3.5 - 5.2 mmol/L   Chloride 104 96 - 106 mmol/L   CO2 25 20 - 29 mmol/L   Calcium 9.1 8.7 - 10.2 mg/dL  LP+ALT+AST Piccolo, Waived  Result Value Ref Range   ALT (SGPT) Piccolo, Waived 36 10 - 47 U/L   AST (SGOT) Piccolo, Waived 31 11 - 38 U/L   Cholesterol Piccolo, Waived 189 <200 mg/dL   HDL Chol Piccolo, Waived 33 (L) >59 mg/dL   Triglycerides Piccolo,Waived 274 (H) <150 mg/dL   Chol/HDL Ratio Piccolo,Waive 5.7 (H) mg/dL   LDL Chol Calc Piccolo Waived 101 (H) <100 mg/dL   VLDL Chol Calc Piccolo,Waive 55 (H) <30 mg/dL  Iron and TIBC  Result Value Ref Range   Total Iron Binding Capacity 299 250 - 450 ug/dL   UIBC 240 111 - 343 ug/dL   Iron 59 38 - 169 ug/dL   Iron Saturation 20 15 - 55 %  Folate  Result Value Ref Range   Folate 8.4 >3.0 ng/mL  Vitamin B12  Result Value Ref Range   Vitamin B-12 514 232 - 1,245 pg/mL  Ferritin  Result Value Ref Range   Ferritin 216 30 - 400 ng/mL  Specimen status report  Result Value Ref Range   specimen status report Comment       Assessment & Plan:   Problem List Items Addressed  This Visit      Other   Hyperlipidemia    Chronic, stable without statin.  Labs today and adjust plan of care as needed.      Relevant Orders   Comprehensive metabolic panel   Lipid Panel w/o Chol/HDL Ratio    Other Visit Diagnoses    Encounter for annual physical exam    -  Primary   Relevant Orders   CBC with Differential/Platelet   Comprehensive metabolic panel   Lipid Panel w/o Chol/HDL Ratio   PSA   TSH       Discussed aspirin prophylaxis for myocardial infarction prevention and decision was it was not indicated  LABORATORY TESTING:  Health maintenance labs ordered today as discussed above.   The natural history of prostate cancer and ongoing controversy regarding screening and potential treatment outcomes of prostate cancer has been discussed with the patient. The meaning of a false positive PSA and a false negative PSA has been discussed. He indicates understanding of the limitations of this screening test and wishes to to proceed with screening PSA testing.   IMMUNIZATIONS:   - Tdap: Tetanus vaccination status reviewed: last tetanus booster within 10 years. - Influenza: Up to date - Pneumovax: Not applicable - Prevnar: Not applicable - Zostavax vaccine: Ordered today  SCREENING: - Colonoscopy: Up to date  Discussed with patient purpose of the colonoscopy is to detect colon cancer at curable precancerous or early stages   - AAA Screening: Not applicable  -Hearing Test: Not applicable  -Spirometry: Not applicable   PATIENT COUNSELING:    Sexuality: Discussed sexually transmitted diseases, partner selection, use of condoms, avoidance of unintended pregnancy  and contraceptive alternatives.   Advised to avoid cigarette smoking.  I discussed with the patient that most people either abstain from alcohol or drink within safe limits (<=14/week and <=4 drinks/occasion for males, <=7/weeks and <= 3 drinks/occasion for females) and that the risk for alcohol disorders  and other health effects rises proportionally with the number of drinks per week and how often a drinker exceeds daily limits.  Discussed cessation/primary prevention  of drug use and availability of treatment for abuse.   Diet: Encouraged to adjust caloric intake to maintain  or achieve ideal body weight, to reduce intake of dietary saturated fat and total fat, to limit sodium intake by avoiding high sodium foods and not adding table salt, and to maintain adequate dietary potassium and calcium preferably from fresh fruits, vegetables, and low-fat dairy products.    stressed the importance of regular exercise  Injury prevention: Discussed safety belts, safety helmets, smoke detector, smoking near bedding or upholstery.   Dental health: Discussed importance of regular tooth brushing, flossing, and dental visits.   Follow up plan: NEXT PREVENTATIVE PHYSICAL DUE IN 1 YEAR. Return in about 1 year (around 01/27/2020) for annual physical.

## 2019-01-28 LAB — COMPREHENSIVE METABOLIC PANEL
ALT: 48 IU/L — ABNORMAL HIGH (ref 0–44)
AST: 38 IU/L (ref 0–40)
Albumin/Globulin Ratio: 1.8 (ref 1.2–2.2)
Albumin: 4.5 g/dL (ref 3.8–4.9)
Alkaline Phosphatase: 90 IU/L (ref 39–117)
BUN/Creatinine Ratio: 12 (ref 9–20)
BUN: 16 mg/dL (ref 6–24)
Bilirubin Total: <0.2 mg/dL (ref 0.0–1.2)
CO2: 25 mmol/L (ref 20–29)
Calcium: 9.6 mg/dL (ref 8.7–10.2)
Chloride: 99 mmol/L (ref 96–106)
Creatinine, Ser: 1.38 mg/dL — ABNORMAL HIGH (ref 0.76–1.27)
GFR calc Af Amer: 65 mL/min/{1.73_m2} (ref >59)
GFR calc non Af Amer: 56 mL/min/{1.73_m2} — ABNORMAL LOW (ref >59)
Globulin, Total: 2.5 g/dL (ref 1.5–4.5)
Glucose: 118 mg/dL — ABNORMAL HIGH (ref 65–99)
Potassium: 4.4 mmol/L (ref 3.5–5.2)
Sodium: 140 mmol/L (ref 134–144)
Total Protein: 7 g/dL (ref 6.0–8.5)

## 2019-01-28 LAB — LIPID PANEL W/O CHOL/HDL RATIO
Cholesterol, Total: 220 mg/dL — ABNORMAL HIGH (ref 100–199)
HDL: 26 mg/dL — ABNORMAL LOW (ref >39)
Triglycerides: 512 mg/dL — ABNORMAL HIGH (ref 0–149)

## 2019-01-28 LAB — CBC WITH DIFFERENTIAL/PLATELET
Basophils Absolute: 0 10*3/uL (ref 0.0–0.2)
Basos: 1 %
EOS (ABSOLUTE): 0.1 10*3/uL (ref 0.0–0.4)
Eos: 3 %
Hematocrit: 39.7 % (ref 37.5–51.0)
Hemoglobin: 13.1 g/dL (ref 13.0–17.7)
Immature Grans (Abs): 0 10*3/uL (ref 0.0–0.1)
Immature Granulocytes: 0 %
Lymphocytes Absolute: 1.6 10*3/uL (ref 0.7–3.1)
Lymphs: 33 %
MCH: 30.8 pg (ref 26.6–33.0)
MCHC: 33 g/dL (ref 31.5–35.7)
MCV: 93 fL (ref 79–97)
Monocytes Absolute: 0.3 10*3/uL (ref 0.1–0.9)
Monocytes: 7 %
Neutrophils Absolute: 2.8 10*3/uL (ref 1.4–7.0)
Neutrophils: 56 %
Platelets: 208 10*3/uL (ref 150–450)
RBC: 4.25 x10E6/uL (ref 4.14–5.80)
RDW: 15.4 % (ref 11.6–15.4)
WBC: 4.9 10*3/uL (ref 3.4–10.8)

## 2019-01-28 LAB — PSA: Prostate Specific Ag, Serum: 0.4 ng/mL (ref 0.0–4.0)

## 2019-01-28 LAB — TSH: TSH: 3.11 u[IU]/mL (ref 0.450–4.500)

## 2019-02-10 ENCOUNTER — Ambulatory Visit: Payer: 59 | Admitting: Family Medicine

## 2019-02-25 ENCOUNTER — Ambulatory Visit: Payer: Self-pay

## 2019-02-25 NOTE — Telephone Encounter (Signed)
Patient had chicken pox in his teens and shingles in his 62's and now his wife just got shingles and he wants to know if he needs to get the vaccine?  Called pt and informed him will route message to the office per Christan's advice.  Reason for Disposition . [1] Caller requesting NON-URGENT health information AND [2] PCP's office is the best resource  Answer Assessment - Initial Assessment Questions 1. REASON FOR CALL or QUESTION: "What is your reason for calling today?" or "How can I best help you?" or "What question do you have that I can help answer?"      Patient had chicken pox in his teens and shingles in his 80's and now his wife just got shingles and he wants to know if he needs to get the vaccine?  Protocols used: INFORMATION ONLY CALL - NO TRIAGE-A-AH

## 2019-02-26 NOTE — Telephone Encounter (Signed)
Call pt Yes he needs the shingles vaccine

## 2019-02-26 NOTE — Telephone Encounter (Signed)
Message relayed to patient. Verbalized understanding and denied questions.   

## 2019-09-15 DIAGNOSIS — M545 Low back pain: Secondary | ICD-10-CM | POA: Diagnosis not present

## 2019-09-15 DIAGNOSIS — M9902 Segmental and somatic dysfunction of thoracic region: Secondary | ICD-10-CM | POA: Diagnosis not present

## 2019-09-15 DIAGNOSIS — M62830 Muscle spasm of back: Secondary | ICD-10-CM | POA: Diagnosis not present

## 2019-09-15 DIAGNOSIS — M542 Cervicalgia: Secondary | ICD-10-CM | POA: Diagnosis not present

## 2019-09-15 DIAGNOSIS — M546 Pain in thoracic spine: Secondary | ICD-10-CM | POA: Diagnosis not present

## 2019-09-15 DIAGNOSIS — M9901 Segmental and somatic dysfunction of cervical region: Secondary | ICD-10-CM | POA: Diagnosis not present

## 2019-09-15 DIAGNOSIS — M62838 Other muscle spasm: Secondary | ICD-10-CM | POA: Diagnosis not present

## 2019-09-15 DIAGNOSIS — M9903 Segmental and somatic dysfunction of lumbar region: Secondary | ICD-10-CM | POA: Diagnosis not present

## 2019-09-17 DIAGNOSIS — M62830 Muscle spasm of back: Secondary | ICD-10-CM | POA: Diagnosis not present

## 2019-09-17 DIAGNOSIS — M9903 Segmental and somatic dysfunction of lumbar region: Secondary | ICD-10-CM | POA: Diagnosis not present

## 2019-09-17 DIAGNOSIS — M9902 Segmental and somatic dysfunction of thoracic region: Secondary | ICD-10-CM | POA: Diagnosis not present

## 2019-09-17 DIAGNOSIS — M542 Cervicalgia: Secondary | ICD-10-CM | POA: Diagnosis not present

## 2019-09-17 DIAGNOSIS — M546 Pain in thoracic spine: Secondary | ICD-10-CM | POA: Diagnosis not present

## 2019-09-17 DIAGNOSIS — M62838 Other muscle spasm: Secondary | ICD-10-CM | POA: Diagnosis not present

## 2019-09-17 DIAGNOSIS — M545 Low back pain: Secondary | ICD-10-CM | POA: Diagnosis not present

## 2019-09-17 DIAGNOSIS — M9901 Segmental and somatic dysfunction of cervical region: Secondary | ICD-10-CM | POA: Diagnosis not present

## 2019-09-24 DIAGNOSIS — M546 Pain in thoracic spine: Secondary | ICD-10-CM | POA: Diagnosis not present

## 2019-09-24 DIAGNOSIS — M62838 Other muscle spasm: Secondary | ICD-10-CM | POA: Diagnosis not present

## 2019-09-24 DIAGNOSIS — M9902 Segmental and somatic dysfunction of thoracic region: Secondary | ICD-10-CM | POA: Diagnosis not present

## 2019-09-24 DIAGNOSIS — M542 Cervicalgia: Secondary | ICD-10-CM | POA: Diagnosis not present

## 2019-09-24 DIAGNOSIS — M9903 Segmental and somatic dysfunction of lumbar region: Secondary | ICD-10-CM | POA: Diagnosis not present

## 2019-09-24 DIAGNOSIS — M545 Low back pain: Secondary | ICD-10-CM | POA: Diagnosis not present

## 2019-09-24 DIAGNOSIS — M9901 Segmental and somatic dysfunction of cervical region: Secondary | ICD-10-CM | POA: Diagnosis not present

## 2019-09-24 DIAGNOSIS — M62830 Muscle spasm of back: Secondary | ICD-10-CM | POA: Diagnosis not present

## 2019-09-29 DIAGNOSIS — M542 Cervicalgia: Secondary | ICD-10-CM | POA: Diagnosis not present

## 2019-09-29 DIAGNOSIS — M546 Pain in thoracic spine: Secondary | ICD-10-CM | POA: Diagnosis not present

## 2019-09-29 DIAGNOSIS — M62838 Other muscle spasm: Secondary | ICD-10-CM | POA: Diagnosis not present

## 2019-09-29 DIAGNOSIS — M9903 Segmental and somatic dysfunction of lumbar region: Secondary | ICD-10-CM | POA: Diagnosis not present

## 2019-09-29 DIAGNOSIS — M9901 Segmental and somatic dysfunction of cervical region: Secondary | ICD-10-CM | POA: Diagnosis not present

## 2019-09-29 DIAGNOSIS — M545 Low back pain: Secondary | ICD-10-CM | POA: Diagnosis not present

## 2019-09-29 DIAGNOSIS — M9902 Segmental and somatic dysfunction of thoracic region: Secondary | ICD-10-CM | POA: Diagnosis not present

## 2019-09-29 DIAGNOSIS — M62830 Muscle spasm of back: Secondary | ICD-10-CM | POA: Diagnosis not present

## 2019-10-01 DIAGNOSIS — M545 Low back pain: Secondary | ICD-10-CM | POA: Diagnosis not present

## 2019-10-01 DIAGNOSIS — M9903 Segmental and somatic dysfunction of lumbar region: Secondary | ICD-10-CM | POA: Diagnosis not present

## 2019-10-01 DIAGNOSIS — M542 Cervicalgia: Secondary | ICD-10-CM | POA: Diagnosis not present

## 2019-10-01 DIAGNOSIS — M9901 Segmental and somatic dysfunction of cervical region: Secondary | ICD-10-CM | POA: Diagnosis not present

## 2019-10-01 DIAGNOSIS — M546 Pain in thoracic spine: Secondary | ICD-10-CM | POA: Diagnosis not present

## 2019-10-01 DIAGNOSIS — M9902 Segmental and somatic dysfunction of thoracic region: Secondary | ICD-10-CM | POA: Diagnosis not present

## 2019-10-01 DIAGNOSIS — M62838 Other muscle spasm: Secondary | ICD-10-CM | POA: Diagnosis not present

## 2019-10-01 DIAGNOSIS — M62830 Muscle spasm of back: Secondary | ICD-10-CM | POA: Diagnosis not present

## 2019-10-02 DIAGNOSIS — M62830 Muscle spasm of back: Secondary | ICD-10-CM | POA: Diagnosis not present

## 2019-10-02 DIAGNOSIS — M62838 Other muscle spasm: Secondary | ICD-10-CM | POA: Diagnosis not present

## 2019-10-02 DIAGNOSIS — M9902 Segmental and somatic dysfunction of thoracic region: Secondary | ICD-10-CM | POA: Diagnosis not present

## 2019-10-02 DIAGNOSIS — M9903 Segmental and somatic dysfunction of lumbar region: Secondary | ICD-10-CM | POA: Diagnosis not present

## 2019-10-02 DIAGNOSIS — M545 Low back pain: Secondary | ICD-10-CM | POA: Diagnosis not present

## 2019-10-02 DIAGNOSIS — M542 Cervicalgia: Secondary | ICD-10-CM | POA: Diagnosis not present

## 2019-10-02 DIAGNOSIS — M546 Pain in thoracic spine: Secondary | ICD-10-CM | POA: Diagnosis not present

## 2019-10-02 DIAGNOSIS — M9901 Segmental and somatic dysfunction of cervical region: Secondary | ICD-10-CM | POA: Diagnosis not present

## 2019-10-06 DIAGNOSIS — M546 Pain in thoracic spine: Secondary | ICD-10-CM | POA: Diagnosis not present

## 2019-10-06 DIAGNOSIS — M9902 Segmental and somatic dysfunction of thoracic region: Secondary | ICD-10-CM | POA: Diagnosis not present

## 2019-10-06 DIAGNOSIS — M545 Low back pain: Secondary | ICD-10-CM | POA: Diagnosis not present

## 2019-10-06 DIAGNOSIS — M62830 Muscle spasm of back: Secondary | ICD-10-CM | POA: Diagnosis not present

## 2019-10-06 DIAGNOSIS — M62838 Other muscle spasm: Secondary | ICD-10-CM | POA: Diagnosis not present

## 2019-10-06 DIAGNOSIS — M9903 Segmental and somatic dysfunction of lumbar region: Secondary | ICD-10-CM | POA: Diagnosis not present

## 2019-10-06 DIAGNOSIS — M542 Cervicalgia: Secondary | ICD-10-CM | POA: Diagnosis not present

## 2019-10-06 DIAGNOSIS — M9901 Segmental and somatic dysfunction of cervical region: Secondary | ICD-10-CM | POA: Diagnosis not present

## 2019-10-09 DIAGNOSIS — M62838 Other muscle spasm: Secondary | ICD-10-CM | POA: Diagnosis not present

## 2019-10-09 DIAGNOSIS — M9903 Segmental and somatic dysfunction of lumbar region: Secondary | ICD-10-CM | POA: Diagnosis not present

## 2019-10-09 DIAGNOSIS — M542 Cervicalgia: Secondary | ICD-10-CM | POA: Diagnosis not present

## 2019-10-09 DIAGNOSIS — M9901 Segmental and somatic dysfunction of cervical region: Secondary | ICD-10-CM | POA: Diagnosis not present

## 2019-10-09 DIAGNOSIS — M9902 Segmental and somatic dysfunction of thoracic region: Secondary | ICD-10-CM | POA: Diagnosis not present

## 2019-10-09 DIAGNOSIS — M546 Pain in thoracic spine: Secondary | ICD-10-CM | POA: Diagnosis not present

## 2019-10-09 DIAGNOSIS — M62830 Muscle spasm of back: Secondary | ICD-10-CM | POA: Diagnosis not present

## 2019-10-09 DIAGNOSIS — M545 Low back pain: Secondary | ICD-10-CM | POA: Diagnosis not present

## 2019-10-13 DIAGNOSIS — M542 Cervicalgia: Secondary | ICD-10-CM | POA: Diagnosis not present

## 2019-10-13 DIAGNOSIS — M62830 Muscle spasm of back: Secondary | ICD-10-CM | POA: Diagnosis not present

## 2019-10-13 DIAGNOSIS — M9901 Segmental and somatic dysfunction of cervical region: Secondary | ICD-10-CM | POA: Diagnosis not present

## 2019-10-13 DIAGNOSIS — M9903 Segmental and somatic dysfunction of lumbar region: Secondary | ICD-10-CM | POA: Diagnosis not present

## 2019-10-13 DIAGNOSIS — M9902 Segmental and somatic dysfunction of thoracic region: Secondary | ICD-10-CM | POA: Diagnosis not present

## 2019-10-13 DIAGNOSIS — M545 Low back pain: Secondary | ICD-10-CM | POA: Diagnosis not present

## 2019-10-13 DIAGNOSIS — M546 Pain in thoracic spine: Secondary | ICD-10-CM | POA: Diagnosis not present

## 2019-10-13 DIAGNOSIS — M62838 Other muscle spasm: Secondary | ICD-10-CM | POA: Diagnosis not present

## 2019-10-15 DIAGNOSIS — M9901 Segmental and somatic dysfunction of cervical region: Secondary | ICD-10-CM | POA: Diagnosis not present

## 2019-10-15 DIAGNOSIS — M546 Pain in thoracic spine: Secondary | ICD-10-CM | POA: Diagnosis not present

## 2019-10-15 DIAGNOSIS — M62838 Other muscle spasm: Secondary | ICD-10-CM | POA: Diagnosis not present

## 2019-10-15 DIAGNOSIS — M9902 Segmental and somatic dysfunction of thoracic region: Secondary | ICD-10-CM | POA: Diagnosis not present

## 2019-10-15 DIAGNOSIS — M542 Cervicalgia: Secondary | ICD-10-CM | POA: Diagnosis not present

## 2019-10-15 DIAGNOSIS — M545 Low back pain: Secondary | ICD-10-CM | POA: Diagnosis not present

## 2019-10-15 DIAGNOSIS — M62830 Muscle spasm of back: Secondary | ICD-10-CM | POA: Diagnosis not present

## 2019-10-15 DIAGNOSIS — M9903 Segmental and somatic dysfunction of lumbar region: Secondary | ICD-10-CM | POA: Diagnosis not present

## 2019-11-18 IMAGING — RF DG UGI W/ KUB
11 of 16 series · 13 of 24 positions shown · non-contrast
Comparison: None.

CLINICAL DATA: Worsening difficulties with oropharyngeal dysphagia

EXAM:
UPPER GI SERIES WITH KUB
TECHNIQUE: After obtaining a scout radiograph a routine upper GI series was
performed using thin and high density barium.
FLUOROSCOPY TIME:  Fluoroscopy Time:  1 minutes
Radiation Exposure Index (if provided by the fluoroscopic device):
13.7 mGy
Number of Acquired Spot Images: 0

[Series 1: t abdomen supine · 0.15mm/px · 1 of 1 slices shown]
[im 1/1]
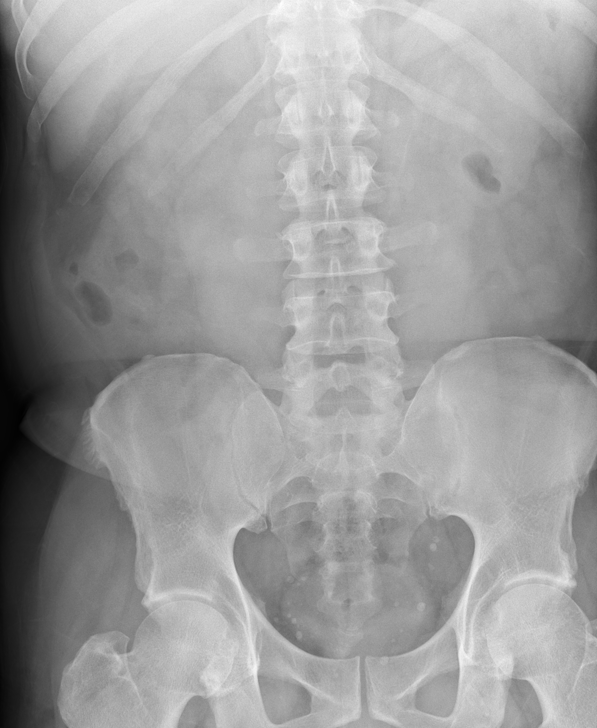

[Series 2: cp_standard · 0.51mm/px · 1 of 38 frames shown (1 of 10)]
[frame 20/38]
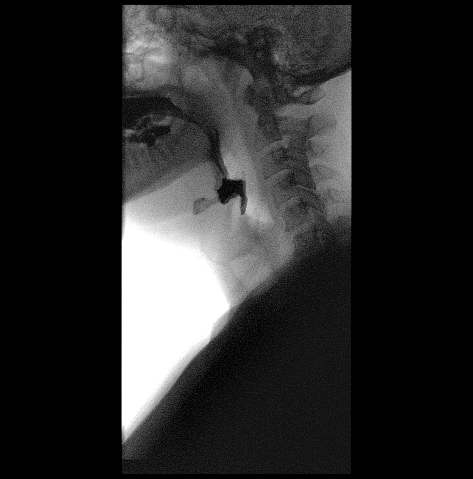

[Series 3: cp_standard · 0.51mm/px · 2 of 54 frames shown (2 of 10)]
[frame 9/54]
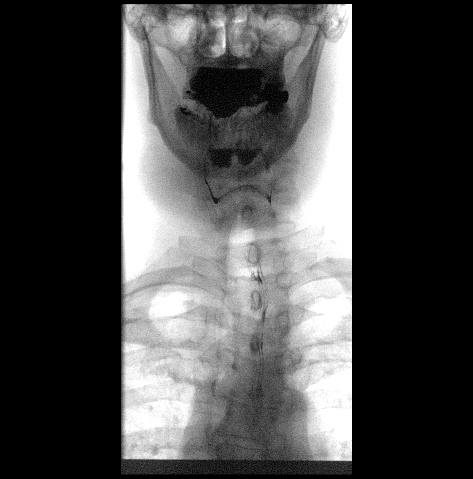
[frame 46/54]
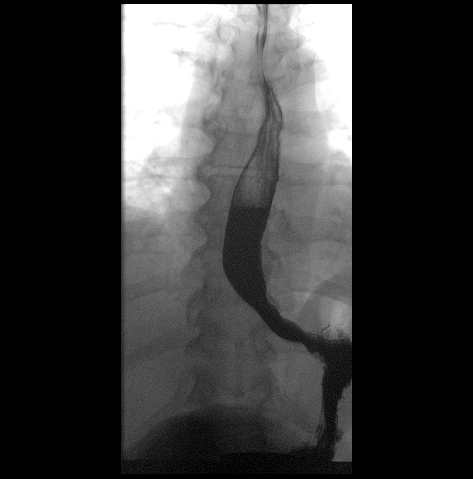

[Series 4: cp_standard · 0.51mm/px · 1 of 26 frames shown (3 of 10)]
[frame 6/26]
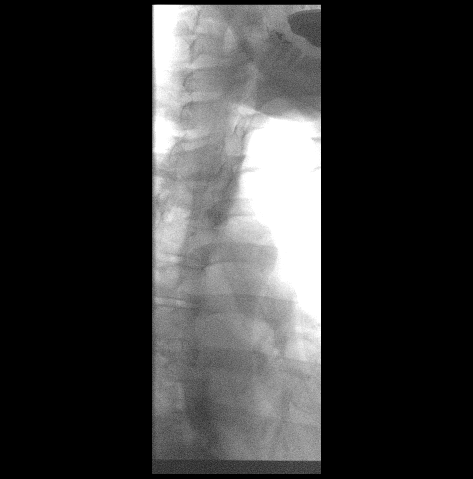

[Series 5: cp_standard · 0.25mm/px · 1 of 1 slices shown (4 of 10)]
[im 1/1]
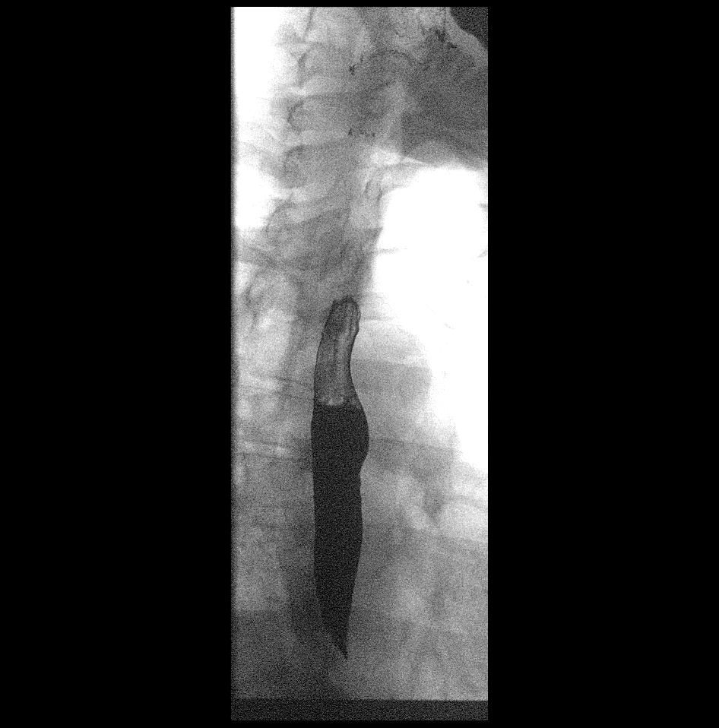

[Series 7: cp_standard · 0.25mm/px · 1 of 1 slices shown (5 of 10)]
[im 1/1]
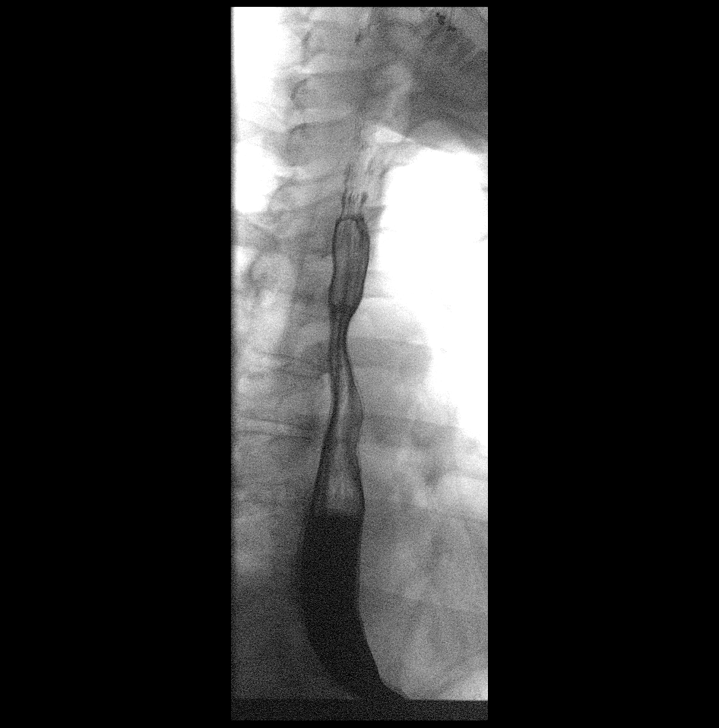

[Series 8: cp_standard · 0.51mm/px · 2 of 32 frames shown (6 of 10)]
[frame 5/32]
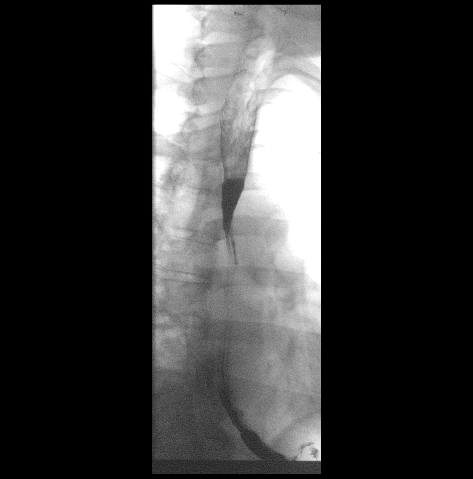
[frame 28/32]
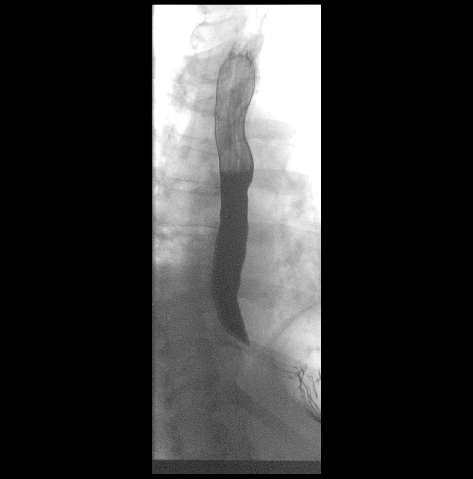

[Series 10: cp_standard · 0.25mm/px · 1 of 1 slices shown (7 of 10)]
[im 1/1]
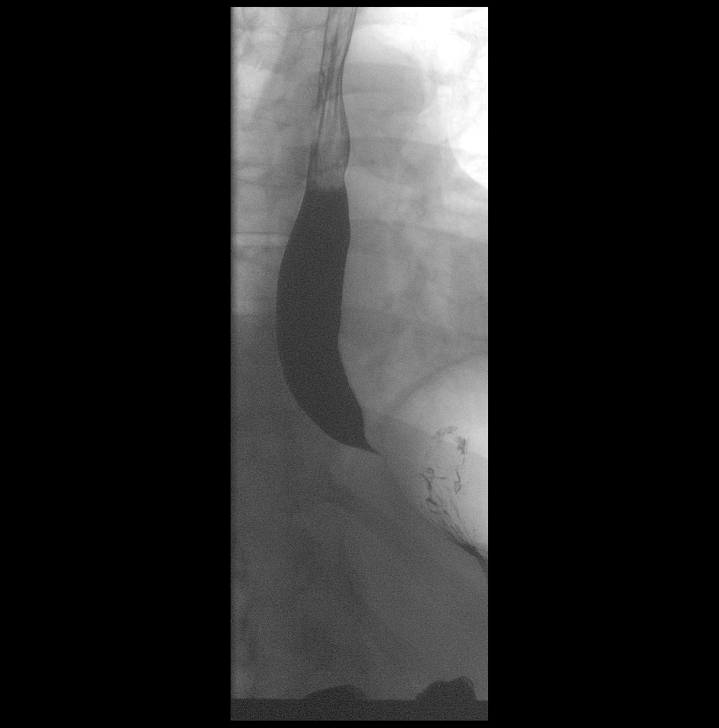

[Series 13: cp_standard · 0.28mm/px · 1 of 1 slices shown (8 of 10)]
[im 1/1]
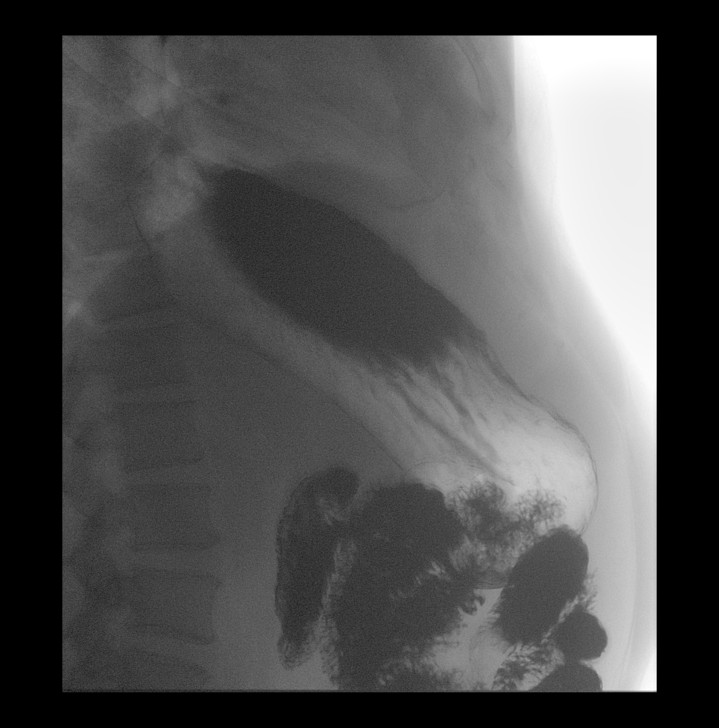

[Series 15: cp_standard · 0.28mm/px · 1 of 1 slices shown (9 of 10)]
[im 1/1]
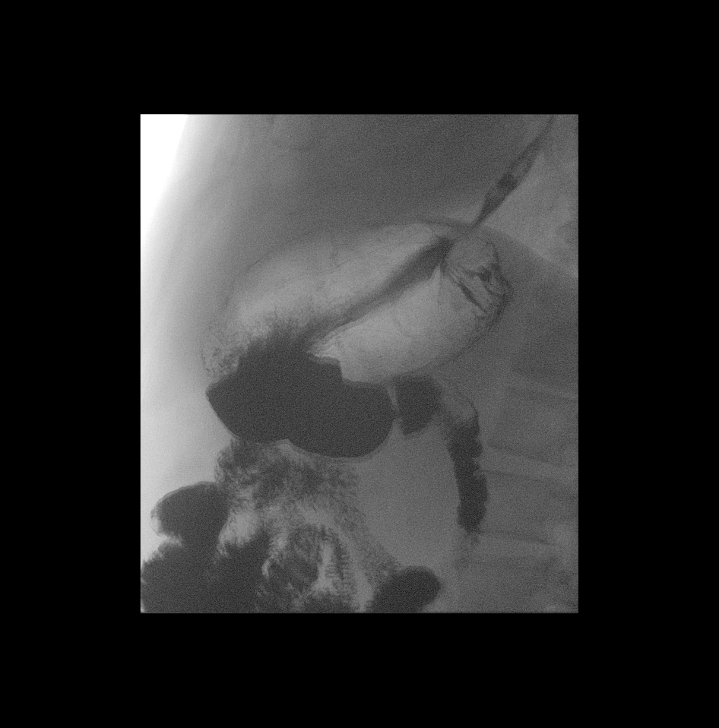

[Series 18: cp_standard · 0.28mm/px · 1 of 1 slices shown (10 of 10)]
[im 1/1]
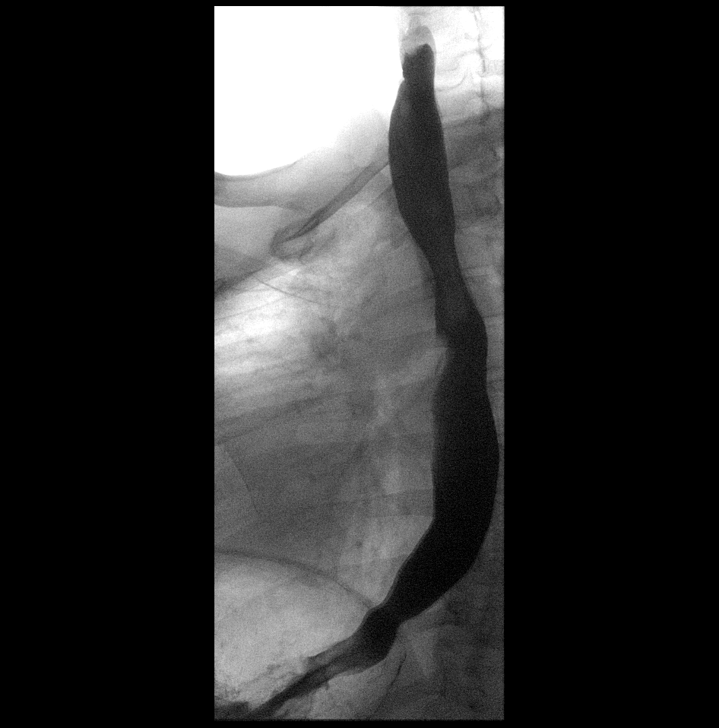

[13 of 24 positions shown; findings below may reference images not displayed]

FINDINGS: KUB:

There is no bowel dilatation to suggest obstruction. There is no
evidence of pneumoperitoneum, portal venous gas or pneumatosis.

There are no pathologic calcifications along the expected course of
the ureters.

The osseous structures are unremarkable.

BARIUM SWALLOW/UPPER GI SERIES:

Examination of the esophagus demonstrated normal esophageal
motility. Normal esophageal morphology without evidence of
esophagitis or ulceration. No esophageal stricture, diverticula, or
mass lesion. No evidence of hiatal hernia. Mild gastroesophageal
reflux.

Examination of the stomach demonstrated normal rugal folds and areae
gastricae. The gastric mucosa appeared unremarkable without evidence
of ulceration, scarring, or mass lesion. Gastric motility and
emptying was normal. Fluoroscopic examination of the duodenum
demonstrates normal motility and morphology without evidence of
ulceration or mass lesion.
IMPRESSION: 1. Mild gastroesophageal reflux.
2. Otherwise normal upper GI and barium swallow.

## 2020-01-29 ENCOUNTER — Encounter: Payer: 59 | Admitting: Family Medicine

## 2020-04-15 DIAGNOSIS — H5203 Hypermetropia, bilateral: Secondary | ICD-10-CM | POA: Diagnosis not present

## 2020-04-15 DIAGNOSIS — H524 Presbyopia: Secondary | ICD-10-CM | POA: Diagnosis not present

## 2020-04-15 DIAGNOSIS — H52223 Regular astigmatism, bilateral: Secondary | ICD-10-CM | POA: Diagnosis not present

## 2020-05-21 ENCOUNTER — Ambulatory Visit: Payer: 59 | Attending: Internal Medicine

## 2020-05-21 ENCOUNTER — Other Ambulatory Visit: Payer: Self-pay | Admitting: Internal Medicine

## 2020-05-21 DIAGNOSIS — Z23 Encounter for immunization: Secondary | ICD-10-CM

## 2020-05-21 NOTE — Progress Notes (Signed)
   Covid-19 Vaccination Clinic  Name:  AMADEO COKE    MRN: [id number] DOB: [birth year]  05/21/2020  Mr. Hagan was observed post Covid-19 immunization for 15 minutes without incident. He was provided with Vaccine Information Sheet and instruction to access the V-Safe system.   Mr. Coe was instructed to call 911 with any severe reactions post vaccine: Marland Kitchen Difficulty breathing  . Swelling of face and throat  . A fast heartbeat  . A bad rash all over body  . Dizziness and weakness   Immunizations Administered    Name Date Dose VIS Date Route   JANSSEN COVID-19 VACCINE 05/21/2020  9:31 AM 0.5 mL 10/04/2019 Intramuscular   Manufacturer: [employer]   Lot: [id number]   [place]: [id number]

## 2020-08-24 ENCOUNTER — Ambulatory Visit: Payer: 59

## 2020-08-25 ENCOUNTER — Other Ambulatory Visit: Payer: Self-pay

## 2020-08-25 ENCOUNTER — Ambulatory Visit (INDEPENDENT_AMBULATORY_CARE_PROVIDER_SITE_OTHER): Payer: 59

## 2020-08-25 DIAGNOSIS — Z23 Encounter for immunization: Secondary | ICD-10-CM | POA: Diagnosis not present

## 2020-12-08 DIAGNOSIS — H66003 Acute suppurative otitis media without spontaneous rupture of ear drum, bilateral: Secondary | ICD-10-CM | POA: Diagnosis not present

## 2021-01-24 ENCOUNTER — Encounter: Payer: Self-pay | Admitting: Internal Medicine

## 2021-01-24 ENCOUNTER — Telehealth: Payer: Self-pay

## 2021-01-24 ENCOUNTER — Other Ambulatory Visit: Payer: Self-pay

## 2021-01-24 ENCOUNTER — Ambulatory Visit (INDEPENDENT_AMBULATORY_CARE_PROVIDER_SITE_OTHER): Payer: 59 | Admitting: Internal Medicine

## 2021-01-24 VITALS — BP 135/86 | HR 71 | Temp 98.6°F | Ht 71.54 in | Wt 221.4 lb

## 2021-01-24 DIAGNOSIS — Z0000 Encounter for general adult medical examination without abnormal findings: Secondary | ICD-10-CM | POA: Diagnosis not present

## 2021-01-24 LAB — URINALYSIS, ROUTINE W REFLEX MICROSCOPIC
Bilirubin, UA: NEGATIVE
Glucose, UA: NEGATIVE
Ketones, UA: NEGATIVE
Leukocytes,UA: NEGATIVE
Nitrite, UA: NEGATIVE
Protein,UA: NEGATIVE
RBC, UA: NEGATIVE
Specific Gravity, UA: 1.01 (ref 1.005–1.030)
Urobilinogen, Ur: 0.2 mg/dL (ref 0.2–1.0)
pH, UA: 5.5 (ref 5.0–7.5)

## 2021-01-24 LAB — BAYER DCA HB A1C WAIVED: HB A1C (BAYER DCA - WAIVED): 6.3 % (ref <7.0)

## 2021-01-24 NOTE — Progress Notes (Signed)
BP 135/86   Pulse 71   Temp 98.6 F (37 C) (Oral)   Ht 5' 11.54" (1.817 m)   Wt 221 lb 6.4 oz (100.4 kg)   SpO2 99%   BMI 30.42 kg/m    Subjective:    Patient ID: Richard Rodgers, male    DOB: 12-Dec-1960, 60 y.o.   MRN: [id number]  Chief Complaint  Patient presents with  . Annual Exam    HPI: Richard Rodgers is a 60 y.o. male  Pt is here for a physcial     Chief Complaint  Patient presents with  . Annual Exam    Relevant past medical, surgical, family and social history reviewed and updated as indicated. Interim medical history since our last visit reviewed. Allergies and medications reviewed and updated.  Review of Systems  Constitutional:  Negative for activity change, appetite change, chills, fatigue and fever.  HENT:  Negative for congestion.   Eyes:  Negative for visual disturbance.  Respiratory:  Negative for apnea, cough, chest tightness, shortness of breath and wheezing.   Cardiovascular:  Negative for chest pain, palpitations and leg swelling.  Gastrointestinal:  Negative for abdominal distention, abdominal pain, diarrhea and nausea.  Endocrine: Negative for cold intolerance, heat intolerance, polydipsia, polyphagia and polyuria.  Genitourinary:  Negative for difficulty urinating, dysuria, frequency, hematuria and urgency.  Skin:  Negative for color change and rash.  Neurological:  Negative for dizziness, speech difficulty, weakness, light-headedness, numbness and headaches.  Psychiatric/Behavioral:  Negative for behavioral problems and confusion. The patient is not nervous/anxious.    Per HPI unless specifically indicated above     Objective:    BP 135/86   Pulse 71   Temp 98.6 F (37 C) (Oral)   Ht 5' 11.54" (1.817 m)   Wt 221 lb 6.4 oz (100.4 kg)   SpO2 99%   BMI 30.42 kg/m   Wt Readings from Last 3 Encounters:  01/24/21 221 lb 6.4 oz (100.4 kg)  01/27/19 226 lb 9.6 oz (102.8 kg)  04/10/18 224 lb 3.2 oz (101.7 kg)    Physical  Exam Vitals and nursing note reviewed.  Constitutional:      General: He is not in acute distress.    Appearance: Normal appearance. He is not ill-appearing or diaphoretic.  HENT:     Head: Normocephalic and atraumatic.     Right Ear: Tympanic membrane and external ear normal. There is no impacted cerumen.     Left Ear: External ear normal.     Nose: No congestion or rhinorrhea.     Mouth/Throat:     Pharynx: No oropharyngeal exudate or posterior oropharyngeal erythema.  Eyes:     Conjunctiva/sclera: Conjunctivae normal.     Pupils: Pupils are equal, round, and reactive to light.  Cardiovascular:     Rate and Rhythm: Normal rate and regular rhythm.     Heart sounds: No murmur heard.   No friction rub. No gallop.  Pulmonary:     Effort: No respiratory distress.     Breath sounds: No stridor. No wheezing or rhonchi.  Chest:     Chest wall: No tenderness.  Abdominal:     General: Abdomen is flat. Bowel sounds are normal.     Palpations: Abdomen is soft. There is no mass.     Tenderness: There is no abdominal tenderness.  Musculoskeletal:     Cervical back: Normal range of motion and neck supple. No rigidity or tenderness.     Left lower leg:  No edema.  Skin:    General: Skin is warm and dry.  Neurological:     Mental Status: He is alert.   Results for orders placed or performed in visit on 01/27/19  CBC with Differential/Platelet  Result Value Ref Range   WBC 4.9 3.4 - 10.8 x10E3/uL   RBC 4.25 4.14 - 5.80 x10E6/uL   Hemoglobin 13.1 13.0 - 17.7 g/dL   Hematocrit 39.7 37.5 - 51.0 %   MCV 93 79 - 97 fL   MCH 30.8 26.6 - 33.0 pg   MCHC 33.0 31.5 - 35.7 g/dL   RDW 15.4 11.6 - 15.4 %   Platelets 208 150 - 450 x10E3/uL   Neutrophils 56 Not Estab. %   Lymphs 33 Not Estab. %   Monocytes 7 Not Estab. %   Eos 3 Not Estab. %   Basos 1 Not Estab. %   Neutrophils Absolute 2.8 1.4 - 7.0 x10E3/uL   Lymphocytes Absolute 1.6 0.7 - 3.1 x10E3/uL   Monocytes Absolute 0.3 0.1 - 0.9  x10E3/uL   EOS (ABSOLUTE) 0.1 0.0 - 0.4 x10E3/uL   Basophils Absolute 0.0 0.0 - 0.2 x10E3/uL   Immature Granulocytes 0 Not Estab. %   Immature Grans (Abs) 0.0 0.0 - 0.1 x10E3/uL  Comprehensive metabolic panel  Result Value Ref Range   Glucose 118 (H) 65 - 99 mg/dL   BUN 16 6 - 24 mg/dL   Creatinine, Ser 1.38 (H) 0.76 - 1.27 mg/dL   GFR calc non Af Amer 56 (L) >59 mL/min/1.73   GFR calc Af Amer 65 >59 mL/min/1.73   BUN/Creatinine Ratio 12 9 - 20   Sodium 140 134 - 144 mmol/L   Potassium 4.4 3.5 - 5.2 mmol/L   Chloride 99 96 - 106 mmol/L   CO2 25 20 - 29 mmol/L   Calcium 9.6 8.7 - 10.2 mg/dL   Total Protein 7.0 6.0 - 8.5 g/dL   Albumin 4.5 3.8 - 4.9 g/dL   Globulin, Total 2.5 1.5 - 4.5 g/dL   Albumin/Globulin Ratio 1.8 1.2 - 2.2   Bilirubin Total <0.2 0.0 - 1.2 mg/dL   Alkaline Phosphatase 90 39 - 117 IU/L   AST 38 0 - 40 IU/L   ALT 48 (H) 0 - 44 IU/L  Lipid Panel w/o Chol/HDL Ratio  Result Value Ref Range   Cholesterol, Total 220 (H) 100 - 199 mg/dL   Triglycerides 512 (H) 0 - 149 mg/dL   HDL 26 (L) >39 mg/dL   VLDL Cholesterol Cal Comment 5 - 40 mg/dL   LDL Calculated Comment 0 - 99 mg/dL  PSA  Result Value Ref Range   Prostate Specific Ag, Serum 0.4 0.0 - 4.0 ng/mL  TSH  Result Value Ref Range   TSH 3.110 0.450 - 4.500 uIU/mL        Current Outpatient Medications:  .  COVID-19 Ad26 vaccine, JANSSEN/J&J, 0.5 ML injection, USE AS DIRECTED, Disp: .5 mL, Rfl: 0 .  folic acid (FOLVITE) 741 MCG tablet, Take 400 mcg by mouth daily., Disp: , Rfl:  .  Multiple Vitamin (MULTIVITAMIN) tablet, Take 1 tablet by mouth daily., Disp: , Rfl:  .  Omega-3 Fatty Acids (FISH OIL) 1000 MG CAPS, Take 1,000 mg by mouth daily., Disp: , Rfl:  .  Plant Sterols and Stanols (CHOLESTOFF PO), Take 900 mcg by mouth 2 (two) times daily., Disp: , Rfl:  .  Potassium 99 MG TABS, Take 1 tablet by mouth daily., Disp: , Rfl:  .  vitamin  B-12 (CYANOCOBALAMIN) 500 MCG tablet, Take 500 mcg by mouth daily.,  Disp: , Rfl:     Assessment & Plan:  PHYSICAL :  Physical Wnl will check CMP, FLP, CBC,TSH, PSA.  2. Prediabetes : ho such  Lifestyle modifications advised to pt. A1c at 6.3   Portion control and avoiding high carb low fat diet advised.  Diet plan given to pt   exercise plan given and encouraged.  To increase exercise to 150 mins a week ie 21/2 hours a week. Pt verbalises understanding of the above.  Results for MATTHEWJAMES, PETRASEK (MRN [id number]) as of 01/24/2021 13:02  Ref. Range 12/12/2017 09:31 02/06/2018 10:35 01/27/2019 09:51 01/24/2021 08:51  Hemoglobin A1C Latest Ref Range: 4.8 - 5.6 % 6.0 (H)     HB A1C (BAYER DCA - WAIVED) Latest Ref Range: <7.0 %    6.3   Problem List Items Addressed This Visit   None Visit Diagnoses     Annual physical exam    -  Primary   Relevant Orders   TSH   PSA   Lipid panel   CBC with Differential/Platelet   Comprehensive metabolic panel   Urinalysis, Routine w reflex microscopic        Orders Placed This Encounter  Procedures  . TSH  . PSA  . Lipid panel  . CBC with Differential/Platelet  . Comprehensive metabolic panel  . Urinalysis, Routine w reflex microscopic     No orders of the defined types were placed in this encounter.    Follow up plan: No follow-ups on file.  Health Maintenance : PSA : 2020 Cscope :2019

## 2021-01-24 NOTE — Telephone Encounter (Signed)
Patient made aware that his A1C has increased to 6.4 and that this can be controlled with diet and exercise

## 2021-01-25 LAB — COMPREHENSIVE METABOLIC PANEL
ALT: 54 IU/L — ABNORMAL HIGH (ref 0–44)
AST: 49 IU/L — ABNORMAL HIGH (ref 0–40)
Albumin/Globulin Ratio: 1.6 (ref 1.2–2.2)
Albumin: 4.4 g/dL (ref 3.8–4.9)
Alkaline Phosphatase: 102 IU/L (ref 44–121)
BUN/Creatinine Ratio: 10 (ref 10–24)
BUN: 14 mg/dL (ref 8–27)
Bilirubin Total: <0.2 mg/dL (ref 0.0–1.2)
CO2: 25 mmol/L (ref 20–29)
Calcium: 9.4 mg/dL (ref 8.6–10.2)
Chloride: 101 mmol/L (ref 96–106)
Creatinine, Ser: 1.45 mg/dL — ABNORMAL HIGH (ref 0.76–1.27)
Globulin, Total: 2.7 g/dL (ref 1.5–4.5)
Glucose: 111 mg/dL — ABNORMAL HIGH (ref 65–99)
Potassium: 4.5 mmol/L (ref 3.5–5.2)
Sodium: 137 mmol/L (ref 134–144)
Total Protein: 7.1 g/dL (ref 6.0–8.5)
eGFR: 55 mL/min/{1.73_m2} — ABNORMAL LOW (ref >59)

## 2021-01-25 LAB — CBC WITH DIFFERENTIAL/PLATELET
Basophils Absolute: 0 10*3/uL (ref 0.0–0.2)
Basos: 0 %
EOS (ABSOLUTE): 0.1 10*3/uL (ref 0.0–0.4)
Eos: 3 %
Hematocrit: 37.8 % (ref 37.5–51.0)
Hemoglobin: 12.9 g/dL — ABNORMAL LOW (ref 13.0–17.7)
Immature Grans (Abs): 0 10*3/uL (ref 0.0–0.1)
Immature Granulocytes: 0 %
Lymphocytes Absolute: 1.6 10*3/uL (ref 0.7–3.1)
Lymphs: 35 %
MCH: 31.1 pg (ref 26.6–33.0)
MCHC: 34.1 g/dL (ref 31.5–35.7)
MCV: 91 fL (ref 79–97)
Monocytes Absolute: 0.4 10*3/uL (ref 0.1–0.9)
Monocytes: 9 %
Neutrophils Absolute: 2.5 10*3/uL (ref 1.4–7.0)
Neutrophils: 53 %
Platelets: 207 10*3/uL (ref 150–450)
RBC: 4.15 x10E6/uL (ref 4.14–5.80)
RDW: 14.2 % (ref 11.6–15.4)
WBC: 4.6 10*3/uL (ref 3.4–10.8)

## 2021-01-25 LAB — LIPID PANEL
Chol/HDL Ratio: 9.5 ratio — ABNORMAL HIGH (ref 0.0–5.0)
Cholesterol, Total: 218 mg/dL — ABNORMAL HIGH (ref 100–199)
HDL: 23 mg/dL — ABNORMAL LOW (ref >39)
LDL Chol Calc (NIH): 91 mg/dL (ref 0–99)
Triglycerides: 627 mg/dL (ref 0–149)
VLDL Cholesterol Cal: 104 mg/dL — ABNORMAL HIGH (ref 5–40)

## 2021-01-25 LAB — TSH: TSH: 3.22 u[IU]/mL (ref 0.450–4.500)

## 2021-01-25 LAB — PSA: Prostate Specific Ag, Serum: 0.4 ng/mL (ref 0.0–4.0)

## 2021-02-11 ENCOUNTER — Telehealth (INDEPENDENT_AMBULATORY_CARE_PROVIDER_SITE_OTHER): Payer: 59 | Admitting: Internal Medicine

## 2021-02-11 ENCOUNTER — Ambulatory Visit: Payer: Self-pay | Admitting: *Deleted

## 2021-02-11 ENCOUNTER — Other Ambulatory Visit: Payer: Self-pay

## 2021-02-11 DIAGNOSIS — E7841 Elevated Lipoprotein(a): Secondary | ICD-10-CM | POA: Diagnosis not present

## 2021-02-11 DIAGNOSIS — Z125 Encounter for screening for malignant neoplasm of prostate: Secondary | ICD-10-CM | POA: Diagnosis not present

## 2021-02-11 DIAGNOSIS — Z136 Encounter for screening for cardiovascular disorders: Secondary | ICD-10-CM

## 2021-02-11 DIAGNOSIS — Z131 Encounter for screening for diabetes mellitus: Secondary | ICD-10-CM

## 2021-02-11 DIAGNOSIS — Z1329 Encounter for screening for other suspected endocrine disorder: Secondary | ICD-10-CM | POA: Diagnosis not present

## 2021-02-11 MED ORDER — AZITHROMYCIN 250 MG PO TABS: ORAL_TABLET | ORAL | 0 refills | Status: AC

## 2021-02-11 MED ORDER — FEXOFENADINE HCL 180 MG PO TABS: 180.0000 mg | ORAL_TABLET | Freq: Every day | ORAL | 11 refills | Status: DC

## 2021-02-11 NOTE — Telephone Encounter (Signed)
Pts wife calling stating that pt tested positive for covid. She states that the pt is needing advice on what meds he should take and quarantine. Please advise.   Called patient to review symptoms. Patient tested positive for covid via at home test Thursday night 02/10/21. Symptoms started on Wednesday 02/09/21 chills, dry cough, stuffy nose, eye red. Denies fever now , no difficulty breathing, denies shortness of breath or headache at this time. Patient requesting medications that may help coughing. Reviewed quarantine guidelines. My chart virtual visit scheduled for 02/11/21. Care advise given. Patient verbalized understanding of care advise and to call back or go to [facility] or ED if symptoms worsen.

## 2021-02-11 NOTE — Telephone Encounter (Signed)
FYI virtual scheduled today

## 2021-02-11 NOTE — Progress Notes (Signed)
There were no vitals taken for this visit.   Subjective:    Patient ID: Richard Rodgers, male    DOB: 12-21-1960, 60 y.o.   MRN: [id number]  Chief Complaint  Patient presents with  . Covid Positive    Test positive last night with a home test.  Patient is having a cough, body aches. Had covid vacc on 10/215/21 (J&J)    HPI: Richard Rodgers is a 59 y.o. male   This visit was completed via video visit through FaceTime due to the restrictions of the COVID-19 pandemic. All issues as above were discussed and addressed. Physical exam was done as above through visual confirmation on video through FaceTime. If it was felt that the patient should be evaluated in the office, they were directed there. The patient verbally consented to this visit. Location of the patient: home Location of the provider: work Those involved with this call:  Provider: Charlynne Cousins, MD CMA: Frazier Butt, [place] Desk/Registration: Levert Feinstein  Time spent on call: 10 minutes with patient face to face via video conference. More than 50% of this time was spent in counseling and coordination of care. 10 minutes total spent in review of patient's record and preparation of their chart.   URI  This is a new problem. There has been no fever. Associated symptoms include coughing, rhinorrhea and a sore throat. Pertinent negatives include no joint pain, nausea, sinus pain or sneezing. Associated symptoms comments:  Started 2 days ago had a cough per pt, tes, tested positive for COVID , had a fever recording it 100.5 F yesterday , now 97 .   Chief Complaint  Patient presents with  . Covid Positive    Test positive last night with a home test.  Patient is having a cough, body aches. Had covid vacc on 10/215/21 (J&J)    Relevant past medical, surgical, family and social history reviewed and updated as indicated. Interim medical history since our last visit reviewed. Allergies and medications reviewed and  updated.  Review of Systems  HENT:  Positive for rhinorrhea and sore throat. Negative for sinus pain and sneezing.   Respiratory:  Positive for cough.   Gastrointestinal:  Negative for nausea.  Musculoskeletal:  Negative for joint pain.   Per HPI unless specifically indicated above     Objective:    There were no vitals taken for this visit.  Wt Readings from Last 3 Encounters:  01/24/21 221 lb 6.4 oz (100.4 kg)  01/27/19 226 lb 9.6 oz (102.8 kg)  04/10/18 224 lb 3.2 oz (101.7 kg)    Physical Exam Unable to peform sec to virtual visit.    Results for orders placed or performed in visit on 01/24/21  TSH  Result Value Ref Range   TSH 3.220 0.450 - 4.500 uIU/mL  PSA  Result Value Ref Range   Prostate Specific Ag, Serum 0.4 0.0 - 4.0 ng/mL  Lipid panel  Result Value Ref Range   Cholesterol, Total 218 (H) 100 - 199 mg/dL   Triglycerides 627 (HH) 0 - 149 mg/dL   HDL 23 (L) >39 mg/dL   VLDL Cholesterol Cal 104 (H) 5 - 40 mg/dL   LDL Chol Calc (NIH) 91 0 - 99 mg/dL   Chol/HDL Ratio 9.5 (H) 0.0 - 5.0 ratio  CBC with Differential/Platelet  Result Value Ref Range   WBC 4.6 3.4 - 10.8 x10E3/uL   RBC 4.15 4.14 - 5.80 x10E6/uL   Hemoglobin 12.9 (L) 13.0 - 17.7  g/dL   Hematocrit 37.8 37.5 - 51.0 %   MCV 91 79 - 97 fL   MCH 31.1 26.6 - 33.0 pg   MCHC 34.1 31.5 - 35.7 g/dL   RDW 14.2 11.6 - 15.4 %   Platelets 207 150 - 450 x10E3/uL   Neutrophils 53 Not Estab. %   Lymphs 35 Not Estab. %   Monocytes 9 Not Estab. %   Eos 3 Not Estab. %   Basos 0 Not Estab. %   Neutrophils Absolute 2.5 1.4 - 7.0 x10E3/uL   Lymphocytes Absolute 1.6 0.7 - 3.1 x10E3/uL   Monocytes Absolute 0.4 0.1 - 0.9 x10E3/uL   EOS (ABSOLUTE) 0.1 0.0 - 0.4 x10E3/uL   Basophils Absolute 0.0 0.0 - 0.2 x10E3/uL   Immature Granulocytes 0 Not Estab. %   Immature Grans (Abs) 0.0 0.0 - 0.1 x10E3/uL  Comprehensive metabolic panel  Result Value Ref Range   Glucose 111 (H) 65 - 99 mg/dL   BUN 14 8 - 27 mg/dL    Creatinine, Ser 1.45 (H) 0.76 - 1.27 mg/dL   eGFR 55 (L) >59 mL/min/1.73   BUN/Creatinine Ratio 10 10 - 24   Sodium 137 134 - 144 mmol/L   Potassium 4.5 3.5 - 5.2 mmol/L   Chloride 101 96 - 106 mmol/L   CO2 25 20 - 29 mmol/L   Calcium 9.4 8.6 - 10.2 mg/dL   Total Protein 7.1 6.0 - 8.5 g/dL   Albumin 4.4 3.8 - 4.9 g/dL   Globulin, Total 2.7 1.5 - 4.5 g/dL   Albumin/Globulin Ratio 1.6 1.2 - 2.2   Bilirubin Total <0.2 0.0 - 1.2 mg/dL   Alkaline Phosphatase 102 44 - 121 IU/L   AST 49 (H) 0 - 40 IU/L   ALT 54 (H) 0 - 44 IU/L  Urinalysis, Routine w reflex microscopic  Result Value Ref Range   Specific Gravity, UA 1.010 1.005 - 1.030   pH, UA 5.5 5.0 - 7.5   Color, UA Yellow Yellow   Appearance Ur Clear Clear   Leukocytes,UA Negative Negative   Protein,UA Negative Negative/Trace   Glucose, UA Negative Negative   Ketones, UA Negative Negative   RBC, UA Negative Negative   Bilirubin, UA Negative Negative   Urobilinogen, Ur 0.2 0.2 - 1.0 mg/dL   Nitrite, UA Negative Negative  Bayer DCA Hb A1c Waived (STAT)  Result Value Ref Range   HB A1C (BAYER DCA - WAIVED) 6.3 <7.0 %        Current Outpatient Medications:  .  COVID-19 Ad26 vaccine, JANSSEN/J&J, 0.5 ML injection, USE AS DIRECTED, Disp: .5 mL, Rfl: 0 .  folic acid (FOLVITE) 381 MCG tablet, Take 400 mcg by mouth daily., Disp: , Rfl:  .  Multiple Vitamin (MULTIVITAMIN) tablet, Take 1 tablet by mouth daily., Disp: , Rfl:  .  Omega-3 Fatty Acids (FISH OIL) 1000 MG CAPS, Take 1,000 mg by mouth daily., Disp: , Rfl:  .  Plant Sterols and Stanols (CHOLESTOFF PO), Take 900 mcg by mouth 2 (two) times daily., Disp: , Rfl:  .  Potassium 99 MG TABS, Take 1 tablet by mouth daily., Disp: , Rfl:  .  vitamin B-12 (CYANOCOBALAMIN) 500 MCG tablet, Take 500 mcg by mouth daily., Disp: , Rfl:     Assessment & Plan:  COVID : positive :  Increase fluid intake.  Headahce - tyelnol every 4-6 hrs prn and alternate this with ibubrufen 800 mg q 8  hrly. Sinus pressure: use steam inhalation.  OTC -  Allegra / claritin.  5 days quarantine.  Ok to rtw in 5 days if tests -ve follow     Problem List Items Addressed This Visit   None    Orders Placed This Encounter  Procedures  . PSA Total+%Free (Serial)  . CBC with Differential/Platelet  . Thyroid Panel With TSH  . Bayer DCA Hb A1c Waived  . Lipid panel  . CMP14+EGFR  . Microalbumin, Urine Waived     Meds ordered this encounter  Medications  . azithromycin (ZITHROMAX) 250 MG tablet    Sig: Take 2 tablets on day 1, then 1 tablet daily on days 2 through 5    Dispense:  6 tablet    Refill:  0  . fexofenadine (ALLEGRA ALLERGY) 180 MG tablet    Sig: Take 1 tablet (180 mg total) by mouth daily.    Dispense:  10 tablet    Refill:  11     Follow up plan: No follow-ups on file.

## 2021-02-11 NOTE — Telephone Encounter (Signed)
Reason for Disposition  [1] HIGH RISK for severe COVID complications (e.g., weak immune system, age > 37 years, obesity with BMI > 25, pregnant, chronic lung disease or other chronic medical condition) AND [2] COVID symptoms (e.g., cough, fever)  (Exceptions: Already seen by PCP and no new or worsening symptoms.)  Answer Assessment - Initial Assessment Questions 1. COVID-19 DIAGNOSIS: "Who made your COVID-19 diagnosis?" "Was it confirmed by a positive lab test or self-test?" If not diagnosed by a doctor (or NP/PA), ask "Are there lots of cases (community spread) where you live?" Note: See public health department website, if unsure.     At home test Thursday  02/10/21 night  2. COVID-19 EXPOSURE: "Was there any known exposure to COVID before the symptoms began?" CDC Definition of close contact: within 6 feet (2 meters) for a total of 15 minutes or more over a 24-hour period.      Work  3. ONSET: "When did the COVID-19 symptoms start?"      Wednesday 02/09/21 4. WORST SYMPTOM: "What is your worst symptom?" (e.g., cough, fever, shortness of breath, muscle aches)     Dry cough, chills, stuffy nose  5. COUGH: "Do you have a cough?" If Yes, ask: "How bad is the cough?"       Cough  6. FEVER: "Do you have a fever?" If Yes, ask: "What is your temperature, how was it measured, and when did it start?"     Yes x 1 100.5 7. RESPIRATORY STATUS: "Describe your breathing?" (e.g., shortness of breath, wheezing, unable to speak)      Breathing ok 8. BETTER-SAME-WORSE: "Are you getting better, staying the same or getting worse compared to yesterday?"  If getting worse, ask, "In what way?"     Worse  9. HIGH RISK DISEASE: "Do you have any chronic medical problems?" (e.g., asthma, heart or lung disease, weak immune system, obesity, etc.)     Diabetic  10. VACCINE: "Have you had the COVID-19 vaccine?" If Yes, ask: "Which one, how many shots, when did you get it?"       Yes Wynetta Emery and [employer] 11. BOOSTER: "Have you  received your COVID-19 booster?" If Yes, ask: "Which one and when did you get it?"       No  12. PREGNANCY: "Is there any chance you are pregnant?" "When was your last menstrual period?"       Na  13. OTHER SYMPTOMS: "Do you have any other symptoms?"  (e.g., chills, fatigue, headache, loss of smell or taste, muscle pain, sore throat)       Chills, headache, dry cough , stuffy nose  14. O2 SATURATION MONITOR:  "Do you use an oxygen saturation monitor (pulse oximeter) at home?" If Yes, ask "What is your reading (oxygen level) today?" "What is your usual oxygen saturation reading?" (e.g., 95%)       na  Protocols used: Coronavirus (COVID-19) Diagnosed or Suspected-A-AH

## 2021-02-14 ENCOUNTER — Encounter: Payer: Self-pay | Admitting: Internal Medicine

## 2021-02-22 ENCOUNTER — Ambulatory Visit (INDEPENDENT_AMBULATORY_CARE_PROVIDER_SITE_OTHER): Payer: 59

## 2021-02-22 ENCOUNTER — Other Ambulatory Visit: Payer: Self-pay

## 2021-02-22 DIAGNOSIS — Z23 Encounter for immunization: Secondary | ICD-10-CM | POA: Diagnosis not present

## 2021-06-13 DIAGNOSIS — H524 Presbyopia: Secondary | ICD-10-CM | POA: Diagnosis not present

## 2021-06-13 LAB — HM DIABETES EYE EXAM

## 2022-01-30 ENCOUNTER — Ambulatory Visit (INDEPENDENT_AMBULATORY_CARE_PROVIDER_SITE_OTHER): Payer: 59 | Admitting: Physician Assistant

## 2022-01-30 ENCOUNTER — Encounter: Payer: Self-pay | Admitting: Physician Assistant

## 2022-01-30 VITALS — BP 149/90 | HR 69 | Temp 98.5°F | Ht 71.54 in | Wt 225.6 lb

## 2022-01-30 DIAGNOSIS — R6882 Decreased libido: Secondary | ICD-10-CM | POA: Diagnosis not present

## 2022-01-30 DIAGNOSIS — Z0000 Encounter for general adult medical examination without abnormal findings: Secondary | ICD-10-CM

## 2022-01-30 DIAGNOSIS — R7303 Prediabetes: Secondary | ICD-10-CM | POA: Diagnosis not present

## 2022-01-30 DIAGNOSIS — Z125 Encounter for screening for malignant neoplasm of prostate: Secondary | ICD-10-CM

## 2022-01-30 DIAGNOSIS — E785 Hyperlipidemia, unspecified: Secondary | ICD-10-CM

## 2022-01-30 DIAGNOSIS — R030 Elevated blood-pressure reading, without diagnosis of hypertension: Secondary | ICD-10-CM | POA: Diagnosis not present

## 2022-01-30 NOTE — Assessment & Plan Note (Signed)
Chronic, historic condition, likely ongoing Review of previous lab results demonstrates A1c in prediabetic ranges  Previous A1c was 6.3 last year- will recheck this year Discussed with pt that medication management may be indicated based on results from labs done today Reports he has been trying to manage with diet and exercise but voiced understanding that medications may be needed to control effectively.  Recommend follow up in 4 weeks when discussing elevated BP to review lab results and management

## 2022-01-30 NOTE — Assessment & Plan Note (Signed)
Chronic, not controlled with medication Reports he has been trying to manage with diet and exercise Repeat lipid panel today  Results to dictate further management  Follow up in 4 weeks to discuss results and plan

## 2022-01-30 NOTE — Progress Notes (Deleted)
Established Patient Office Visit  Name: Richard Rodgers   MRN: [id number]    DOB: [birth year]   Date:01/30/2022  Today's Provider: Jacquelin Hawking, MHS, PA-C Introduced myself to the patient as a PA-C and provided education on APPs in clinical practice.         Subjective  Chief Complaint  No chief complaint on file.   HPI   Patient Active Problem List   Diagnosis Date Noted   Hyperlipidemia 02/06/2018   Screening for colon cancer    Polyp of sigmoid colon     Past Surgical History:  Procedure Laterality Date   COLONOSCOPY     COLONOSCOPY WITH PROPOFOL N/A 01/07/2018   Procedure: COLONOSCOPY WITH PROPOFOL;  Surgeon: Pasty Spillers, MD;  Location: [facility] ENDOSCOPY;  Service: Endoscopy;  Laterality: N/A;   CYST REMOVAL LEG Left    Knee   VASECTOMY      Family History  Problem Relation Age of Onset   Diabetes Mother    Hypertension Mother    Stroke Father    Lupus Sister    Diabetes Brother    Diabetes Maternal Grandmother     Social History   Tobacco Use   Smoking status: Never   Smokeless tobacco: Never  Substance Use Topics   Alcohol use: Never     Current Outpatient Medications:    fexofenadine (ALLEGRA ALLERGY) 180 MG tablet, Take 1 tablet (180 mg total) by mouth daily., Disp: 10 tablet, Rfl: 11   folic acid (FOLVITE) 400 MCG tablet, Take 400 mcg by mouth daily., Disp: , Rfl:    Multiple Vitamin (MULTIVITAMIN) tablet, Take 1 tablet by mouth daily., Disp: , Rfl:    Omega-3 Fatty Acids (FISH OIL) 1000 MG CAPS, Take 1,000 mg by mouth daily., Disp: , Rfl:    Plant Sterols and Stanols (CHOLESTOFF PO), Take 900 mcg by mouth 2 (two) times daily., Disp: , Rfl:    Potassium 99 MG TABS, Take 1 tablet by mouth daily., Disp: , Rfl:    vitamin B-12 (CYANOCOBALAMIN) 500 MCG tablet, Take 500 mcg by mouth daily., Disp: , Rfl:   No Known Allergies  I personally reviewed {Reviewed:[id number]} with the patient/caregiver today.   ROS    Objective  There were  no vitals filed for this visit.  There is no height or weight on file to calculate BMI.  Physical Exam   No results found for this or any previous visit (from the past 2160 hour(s)).   PHQ2/9:    01/24/2021    8:31 AM 01/27/2019    9:22 AM 12/12/2017    8:59 AM  Depression screen PHQ 2/9  Decreased Interest 0 0 0  Down, Depressed, Hopeless 0 0 0  PHQ - 2 Score 0 0 0  Altered sleeping 0 0   Tired, decreased energy 0 0   Change in appetite 0 0   Feeling bad or failure about yourself  0 0   Trouble concentrating 0 0   Moving slowly or fidgety/restless 0 0   Suicidal thoughts 0 0   PHQ-9 Score 0 0   Difficult doing work/chores  Not difficult at all       Fall Risk:    02/11/2021   11:14 AM 01/24/2021    8:31 AM 01/27/2019    9:22 AM 12/12/2017    8:59 AM  Fall Risk   Falls in the past year? 0 0 0 No  Number falls in past  yr: 0 0 0   Injury with Fall? 0 0 0   Risk for fall due to : No Fall Risks No Fall Risks    Follow up Falls evaluation completed Falls evaluation completed Falls evaluation completed       Functional Status Survey:      Assessment & Plan

## 2022-01-31 LAB — CBC WITH DIFFERENTIAL/PLATELET
Basophils Absolute: 0 10*3/uL (ref 0.0–0.2)
Basos: 1 %
EOS (ABSOLUTE): 0.1 10*3/uL (ref 0.0–0.4)
Eos: 3 %
Hematocrit: 37.5 % (ref 37.5–51.0)
Hemoglobin: 12.8 g/dL — ABNORMAL LOW (ref 13.0–17.7)
Immature Grans (Abs): 0 10*3/uL (ref 0.0–0.1)
Immature Granulocytes: 0 %
Lymphocytes Absolute: 1.7 10*3/uL (ref 0.7–3.1)
Lymphs: 36 %
MCH: 31.4 pg (ref 26.6–33.0)
MCHC: 34.1 g/dL (ref 31.5–35.7)
MCV: 92 fL (ref 79–97)
Monocytes Absolute: 0.4 10*3/uL (ref 0.1–0.9)
Monocytes: 8 %
Neutrophils Absolute: 2.5 10*3/uL (ref 1.4–7.0)
Neutrophils: 52 %
Platelets: 195 10*3/uL (ref 150–450)
RBC: 4.08 x10E6/uL — ABNORMAL LOW (ref 4.14–5.80)
RDW: 14.2 % (ref 11.6–15.4)
WBC: 4.7 10*3/uL (ref 3.4–10.8)

## 2022-01-31 LAB — HEMOGLOBIN A1C
Est. average glucose Bld gHb Est-mCnc: 148 mg/dL
Hgb A1c MFr Bld: 6.8 % — ABNORMAL HIGH (ref 4.8–5.6)

## 2022-01-31 LAB — TSH: TSH: 3.56 u[IU]/mL (ref 0.450–4.500)

## 2022-01-31 LAB — COMPREHENSIVE METABOLIC PANEL
ALT: 39 IU/L (ref 0–44)
AST: 31 IU/L (ref 0–40)
Albumin/Globulin Ratio: 1.4 (ref 1.2–2.2)
Albumin: 4.2 g/dL (ref 3.8–4.8)
Alkaline Phosphatase: 106 IU/L (ref 44–121)
BUN/Creatinine Ratio: 10 (ref 10–24)
BUN: 14 mg/dL (ref 8–27)
Bilirubin Total: <0.2 mg/dL (ref 0.0–1.2)
CO2: 24 mmol/L (ref 20–29)
Calcium: 9.2 mg/dL (ref 8.6–10.2)
Chloride: 106 mmol/L (ref 96–106)
Creatinine, Ser: 1.41 mg/dL — ABNORMAL HIGH (ref 0.76–1.27)
Globulin, Total: 2.9 g/dL (ref 1.5–4.5)
Glucose: 119 mg/dL — ABNORMAL HIGH (ref 70–99)
Potassium: 4.6 mmol/L (ref 3.5–5.2)
Sodium: 142 mmol/L (ref 134–144)
Total Protein: 7.1 g/dL (ref 6.0–8.5)
eGFR: 57 mL/min/{1.73_m2} — ABNORMAL LOW (ref >59)

## 2022-01-31 LAB — PSA: Prostate Specific Ag, Serum: 0.3 ng/mL (ref 0.0–4.0)

## 2022-01-31 LAB — LIPID PANEL
Chol/HDL Ratio: 5.7 ratio — ABNORMAL HIGH (ref 0.0–5.0)
Cholesterol, Total: 165 mg/dL (ref 100–199)
HDL: 29 mg/dL — ABNORMAL LOW (ref >39)
LDL Chol Calc (NIH): 103 mg/dL — ABNORMAL HIGH (ref 0–99)
Triglycerides: 187 mg/dL — ABNORMAL HIGH (ref 0–149)
VLDL Cholesterol Cal: 33 mg/dL (ref 5–40)

## 2022-01-31 LAB — TESTOSTERONE: Testosterone: 222 ng/dL — ABNORMAL LOW (ref 264–916)

## 2022-02-17 ENCOUNTER — Ambulatory Visit: Payer: Self-pay | Admitting: *Deleted

## 2022-02-17 NOTE — Telephone Encounter (Signed)
  Chief Complaint: Concerns regarding higher BP.  Symptoms: None  Frequency: ongoing Pertinent Negatives: Patient denies sob, chest pain numbness HA Disposition: '[]'$ ED /'[]'$ Urgent Care (no appt availability in office) / '[]'$ Appointment(In office/virtual)/ '[]'$  [facility]/ '[]'$ Home Care/ '[]'$ Refused Recommended Disposition /'[]'$ [facility]/ '[x]'$  Follow-up with PCP Additional Notes: PT will continue to monitor BP and record readings for OV on the 24th. Reviewed proper technique for taking BP with pt.    Summary: BP Advice   Pt is calling with concern over his bp that the top number is ranging 117-154. And the bottom number ranges 70s and 80s. Pt is asking should be concerned?      Reason for Disposition  [6] Systolic BP  >= 754 OR Diastolic >= 80 AND [4] not taking BP medications  Answer Assessment - Initial Assessment Questions 1. BLOOD PRESSURE: "What is the blood pressure?" "Did you take at least two measurements 5 minutes apart?"     Pt takes BP each morning before breakfast. Pt gave many reading of BP he has taken. Systolic ranges from 920 to 100, diastolic from 70 to 97.  2. ONSET: "When did you take your blood pressure?"     everyday 3. HOW: "How did you obtain the blood pressure?" (e.g., visiting nurse, automatic home BP monitor)     automatic 4. HISTORY: "Do you have a history of high blood pressure?"     no 5. MEDICATIONS: "Are you taking any medications for blood pressure?" "Have you missed any doses recently?"     no 6. OTHER SYMPTOMS: "Do you have any symptoms?" (e.g., headache, chest pain, blurred vision, difficulty breathing, weakness)     no 7. PREGNANCY: "Is there any chance you are pregnant?" "When was your last menstrual period?"     na  Protocols used: Blood Pressure - High-A-AH

## 2022-02-17 NOTE — Telephone Encounter (Signed)
Summary: BP Advice   Pt is calling with concern over his bp that the top number is ranging 117-154. And the bottom number ranges 70s and 80s. Pt is asking should be concerned?      Message left for pt.

## 2022-02-27 ENCOUNTER — Ambulatory Visit: Payer: 59 | Admitting: Physician Assistant

## 2022-02-27 ENCOUNTER — Encounter: Payer: Self-pay | Admitting: Physician Assistant

## 2022-02-27 VITALS — BP 136/85 | HR 74 | Temp 98.0°F | Ht 71.57 in | Wt 224.6 lb

## 2022-02-27 DIAGNOSIS — E118 Type 2 diabetes mellitus with unspecified complications: Secondary | ICD-10-CM | POA: Insufficient documentation

## 2022-02-27 DIAGNOSIS — I10 Essential (primary) hypertension: Secondary | ICD-10-CM | POA: Insufficient documentation

## 2022-02-27 DIAGNOSIS — E119 Type 2 diabetes mellitus without complications: Secondary | ICD-10-CM

## 2022-02-27 MED ORDER — AMLODIPINE BESYLATE 5 MG PO TABS: 5.0000 mg | ORAL_TABLET | Freq: Every day | ORAL | 0 refills | Status: DC

## 2022-02-27 NOTE — Progress Notes (Signed)
Established Patient Office Visit  Name: Richard Rodgers   MRN: [id number]    DOB: February 17, 1961   Date:02/27/2022  Today's Provider: Talitha Givens, MHS, PA-C Introduced myself to the patient as a PA-C and provided education on APPs in clinical practice.         Subjective  Chief Complaint  Chief Complaint  Patient presents with   Hypertension    Patient has brought in his BP for over the last 4 week    HPI   Elevated BP at last apt Instructed him to take it daily for 4 weeks and return with results Results are predominantly 130s and up most days with some results in 150s Exercise: states he is doing resistance bands and walking. Walking usually 3 miles per day  Diet: "good" He has tried to cut out starches and soft drinks    Diabetes, Type 2- newly diagnosed - Last A1c 6.8  - Medications: None at this time - Compliance: Trying to manage with diet and exercise  - Checking BG at home: Not at this time  - Diet: "good" He has tried to cut out starches and soft drinks - Exercise: states he is doing resistance bands and walking. Walking usually 3 miles per day  - Eye exam: Reviewed importance of yearly diabetic eye exam - Foot exam: Complete at next visit - Microalbumin: Will order this year - Statin: Not currently on statin therapy - PNA vaccine:  - Denies symptoms of hypoglycemia, polyuria, polydipsia, numbness extremities, foot ulcers/trauma      Patient Active Problem List   Diagnosis Date Noted   Primary hypertension 02/27/2022   Diabetes mellitus without complication ([place]) 57/84/6962   Prediabetes 01/30/2022   Decreased libido 01/30/2022   Hyperlipidemia 02/06/2018   Screening for colon cancer    Polyp of sigmoid colon     Past Surgical History:  Procedure Laterality Date   COLONOSCOPY     COLONOSCOPY WITH PROPOFOL N/A 01/07/2018   Procedure: COLONOSCOPY WITH PROPOFOL;  Surgeon: Virgel Manifold, MD;  Location: [facility] ENDOSCOPY;  Service: Endoscopy;   Laterality: N/A;   CYST REMOVAL LEG Left    Knee   VASECTOMY      Family History  Problem Relation Age of Onset   Diabetes Mother    Hypertension Mother    Stroke Father    Lupus Sister    Diabetes Brother    Diabetes Maternal Grandmother     Social History   Tobacco Use   Smoking status: Never   Smokeless tobacco: Never  Substance Use Topics   Alcohol use: Never     Current Outpatient Medications:    amLODipine (NORVASC) 5 MG tablet, Take 1 tablet (5 mg total) by mouth daily., Disp: 60 tablet, Rfl: 0   fexofenadine (ALLEGRA ALLERGY) 180 MG tablet, Take 1 tablet (180 mg total) by mouth daily., Disp: 10 tablet, Rfl: 11   folic acid (FOLVITE) 952 MCG tablet, Take 400 mcg by mouth daily., Disp: , Rfl:    Multiple Vitamin (MULTIVITAMIN) tablet, Take 1 tablet by mouth daily., Disp: , Rfl:    Omega-3 Fatty Acids (FISH OIL) 1000 MG CAPS, Take 1,000 mg by mouth daily., Disp: , Rfl:    Plant Sterols and Stanols (CHOLESTOFF PO), Take 900 mcg by mouth 2 (two) times daily., Disp: , Rfl:    Potassium 99 MG TABS, Take 1 tablet by mouth daily., Disp: , Rfl:    vitamin B-12 (CYANOCOBALAMIN) 500  MCG tablet, Take 500 mcg by mouth daily., Disp: , Rfl:   No Known Allergies  I personally reviewed active problem list, medication list, allergies, health maintenance, lab results with the patient/caregiver today.   Review of Systems  Constitutional:  Negative for chills and fever.  Eyes:  Negative for blurred vision and double vision.  Respiratory:  Negative for shortness of breath and wheezing.   Cardiovascular:  Negative for chest pain and leg swelling.  Gastrointestinal:  Negative for constipation, diarrhea, nausea and vomiting.  Musculoskeletal:  Negative for falls.  Neurological:  Negative for dizziness and headaches.  Endo/Heme/Allergies:  Negative for polydipsia.      Objective  Vitals:   02/27/22 0913 02/27/22 0916  BP: (!) 143/81 136/85  Pulse: 79 74  Temp: 98 F (36.7 C)    TempSrc: Oral   SpO2: 96%   Weight: 224 lb 9.6 oz (101.9 kg)   Height: 5' 11.57" (1.818 m)     Body mass index is 30.83 kg/m.  Physical Exam Vitals reviewed.  Constitutional:      General: He is awake.     Appearance: Normal appearance. He is well-developed, well-groomed and overweight.  HENT:     Head: Normocephalic and atraumatic.  Cardiovascular:     Rate and Rhythm: Normal rate and regular rhythm.     Pulses: Normal pulses.          Radial pulses are 2+ on the right side and 2+ on the left side.     Heart sounds: Normal heart sounds. No murmur heard.    No friction rub. No gallop.  Pulmonary:     Effort: Pulmonary effort is normal.     Breath sounds: Normal breath sounds.  Musculoskeletal:     Cervical back: Normal range of motion and neck supple.     Right lower leg: No edema.     Left lower leg: No edema.  Neurological:     General: No focal deficit present.     Mental Status: He is alert and oriented to person, place, and time. Mental status is at baseline.     GCS: GCS eye subscore is 4. GCS verbal subscore is 5. GCS motor subscore is 6.     Cranial Nerves: No dysarthria or facial asymmetry.  Psychiatric:        Attention and Perception: Attention and perception normal.        Mood and Affect: Mood and affect normal.        Speech: Speech normal.        Behavior: Behavior normal. Behavior is cooperative.        Cognition and Memory: Cognition normal.      Recent Results (from the past 2160 hour(s))  HgB A1c     Status: Abnormal   Collection Time: 01/30/22  9:05 AM  Result Value Ref Range   Hgb A1c MFr Bld 6.8 (H) 4.8 - 5.6 %    Comment:          Prediabetes: 5.7 - 6.4          Diabetes: >6.4          Glycemic control for adults with diabetes: <7.0    Est. average glucose Bld gHb Est-mCnc 148 mg/dL  CBC w/Diff     Status: Abnormal   Collection Time: 01/30/22  9:05 AM  Result Value Ref Range   WBC 4.7 3.4 - 10.8 x10E3/uL   RBC 4.08 (L) 4.14 - 5.80  x10E6/uL   Hemoglobin  12.8 (L) 13.0 - 17.7 g/dL   Hematocrit 37.5 37.5 - 51.0 %   MCV 92 79 - 97 fL   MCH 31.4 26.6 - 33.0 pg   MCHC 34.1 31.5 - 35.7 g/dL   RDW 14.2 11.6 - 15.4 %   Platelets 195 150 - 450 x10E3/uL   Neutrophils 52 Not Estab. %   Lymphs 36 Not Estab. %   Monocytes 8 Not Estab. %   Eos 3 Not Estab. %   Basos 1 Not Estab. %   Neutrophils Absolute 2.5 1.4 - 7.0 x10E3/uL   Lymphocytes Absolute 1.7 0.7 - 3.1 x10E3/uL   Monocytes Absolute 0.4 0.1 - 0.9 x10E3/uL   EOS (ABSOLUTE) 0.1 0.0 - 0.4 x10E3/uL   Basophils Absolute 0.0 0.0 - 0.2 x10E3/uL   Immature Granulocytes 0 Not Estab. %   Immature Grans (Abs) 0.0 0.0 - 0.1 x10E3/uL  Comp Met (CMET)     Status: Abnormal   Collection Time: 01/30/22  9:05 AM  Result Value Ref Range   Glucose 119 (H) 70 - 99 mg/dL   BUN 14 8 - 27 mg/dL   Creatinine, Ser 1.41 (H) 0.76 - 1.27 mg/dL   eGFR 57 (L) >59 mL/min/1.73   BUN/Creatinine Ratio 10 10 - 24   Sodium 142 134 - 144 mmol/L   Potassium 4.6 3.5 - 5.2 mmol/L   Chloride 106 96 - 106 mmol/L   CO2 24 20 - 29 mmol/L   Calcium 9.2 8.6 - 10.2 mg/dL   Total Protein 7.1 6.0 - 8.5 g/dL   Albumin 4.2 3.8 - 4.8 g/dL   Globulin, Total 2.9 1.5 - 4.5 g/dL   Albumin/Globulin Ratio 1.4 1.2 - 2.2   Bilirubin Total <0.2 0.0 - 1.2 mg/dL   Alkaline Phosphatase 106 44 - 121 IU/L   AST 31 0 - 40 IU/L   ALT 39 0 - 44 IU/L  TSH     Status: None   Collection Time: 01/30/22  9:05 AM  Result Value Ref Range   TSH 3.560 0.450 - 4.500 uIU/mL  PSA     Status: None   Collection Time: 01/30/22  9:05 AM  Result Value Ref Range   Prostate Specific Ag, Serum 0.3 0.0 - 4.0 ng/mL    Comment: Roche ECLIA methodology. According to the American Urological Association, Serum PSA should decrease and remain at undetectable levels after radical prostatectomy. The AUA defines biochemical recurrence as an initial PSA value 0.2 ng/mL or greater followed by a subsequent confirmatory PSA value 0.2 ng/mL or  greater. Values obtained with different assay methods or kits cannot be used interchangeably. Results cannot be interpreted as absolute evidence of the presence or absence of malignant disease.   Lipid Profile     Status: Abnormal   Collection Time: 01/30/22  9:05 AM  Result Value Ref Range   Cholesterol, Total 165 100 - 199 mg/dL   Triglycerides 187 (H) 0 - 149 mg/dL   HDL 29 (L) >39 mg/dL   VLDL Cholesterol Cal 33 5 - 40 mg/dL   LDL Chol Calc (NIH) 103 (H) 0 - 99 mg/dL   Chol/HDL Ratio 5.7 (H) 0.0 - 5.0 ratio    Comment:                                   T. Chol/HDL Ratio  Men  Women                               1/2 Avg.Risk  3.4    3.3                                   Avg.Risk  5.0    4.4                                2X Avg.Risk  9.6    7.1                                3X Avg.Risk 23.4   11.0   Testosterone     Status: Abnormal   Collection Time: 01/30/22  9:05 AM  Result Value Ref Range   Testosterone 222 (L) 264 - 916 ng/dL    Comment: Adult male reference interval is based on a population of healthy nonobese males (BMI <30) between 36 and 82 years old. [place], [place] [phone]. PMID: [id number].      PHQ2/9:    02/27/2022    9:28 AM 01/30/2022    9:21 AM 01/24/2021    8:31 AM 01/27/2019    9:22 AM 12/12/2017    8:59 AM  Depression screen PHQ 2/9  Decreased Interest 0 0 0 0 0  Down, Depressed, Hopeless 0 0 0 0 0  PHQ - 2 Score 0 0 0 0 0  Altered sleeping 0 0 0 0   Tired, decreased energy 0 0 0 0   Change in appetite 0 0 0 0   Feeling bad or failure about yourself  0 0 0 0   Trouble concentrating 0 0 0 0   Moving slowly or fidgety/restless 0 0 0 0   Suicidal thoughts 0 0 0 0   PHQ-9 Score 0 0 0 0   Difficult doing work/chores Not difficult at all Not difficult at all  Not difficult at all       Fall Risk:    02/27/2022    9:28 AM 01/30/2022    9:21 AM 02/11/2021   11:14 AM 01/24/2021    8:31 AM  01/27/2019    9:22 AM  Fall Risk   Falls in the past year? 0 0 0 0 0  Number falls in past yr: 0 0 0 0 0  Injury with Fall? 0 0 0 0 0  Risk for fall due to : No Fall Risks No Fall Risks No Fall Risks No Fall Risks   Follow up _0       Functional Status Survey:      Assessment & Plan  Problem List Items Addressed This Visit       Cardiovascular and Mediastinum   Primary hypertension    Newly diagnosed, currently not managed Pt had elevated BP readings at previous visit and was instructed to take BP daily for 4 weeks and return with results Results demonstrate sustained BP elevations consistent with Stage 1 HTN  Will initiate Amlodipine 5 mg PO QD today Discussed side effects with pt along with importance of taking this daily. Recommend he continue with exercise and diet efforts -  he voiced understanding and agreement to this Recommend he continue taking daily BP at home for 4 weeks while taking Amlodipine so we can assess response Follow up in 4 weeks.       Relevant Medications   amLODipine (NORVASC) 5 MG tablet     Endocrine   Diabetes mellitus without complication ([place]) - Primary    Newly diagnosed, not currently on medications Most recent A1c was 6.8% indicating progression from prediabetes to T2DM at this time He is currently exercising and engaged in carb-conscious diet  [facility] place [facility] for [facility] and diabetes education to further assist with lifestyle management of symptoms If A1c is staying <7 without medications, pt and I agree to continue with lifestyle management If A1c increases >7 we can add Metformin 500 mg PO QD to start.  Follow up in 3 months for monitoring       Relevant Orders   Referral to Nutrition and Diabetes Services     Return in about 4 weeks (around 03/27/2022) for HTN, Diabetes follow  up.   I, Franca Stakes E Mako Pelfrey, PA-C, have reviewed all documentation for this visit. The documentation on 02/27/22 for the exam, diagnosis, procedures, and orders are all accurate and complete.   Talitha Givens, MHS, PA-C [facility]

## 2022-02-27 NOTE — Assessment & Plan Note (Signed)
Newly diagnosed, currently not managed Pt had elevated BP readings at previous visit and was instructed to take BP daily for 4 weeks and return with results Results demonstrate sustained BP elevations consistent with Stage 1 HTN  Will initiate Amlodipine 5 mg PO QD today Discussed side effects with pt along with importance of taking this daily. Recommend he continue with exercise and diet efforts - he voiced understanding and agreement to this Recommend he continue taking daily BP at home for 4 weeks while taking Amlodipine so we can assess response Follow up in 4 weeks.

## 2022-02-27 NOTE — Assessment & Plan Note (Addendum)
Newly diagnosed, not currently on medications Most recent A1c was 6.8% indicating progression from prediabetes to T2DM at this time He is currently exercising and engaged in carb-conscious diet  Will place referral to [facility] for nutrition and diabetes education to further assist with lifestyle management of symptoms If A1c is staying <7 without medications, pt and I agree to continue with lifestyle management If A1c increases >7 we can add Metformin 500 mg PO QD to start or Farxiga for kidney protection  Follow up in 3 months for monitoring

## 2022-02-27 NOTE — Patient Instructions (Addendum)
I have placed a referral to the [facility] to help with diabetes education. The referral team should call you soon to help set this up   Please start the Amlodipine 5 mg by mouth once per day. Continue taking your blood pressure every day and record it so we can make sure the Amlodipine is working when we see you at follow up   It is important that you get your eyes checked every year with a diabetic eye exam. When you get this done, please have the vision office send a copy of the results to [place] so we can update your chart.

## 2022-03-15 ENCOUNTER — Ambulatory Visit: Payer: Self-pay

## 2022-03-15 NOTE — Telephone Encounter (Signed)
   Chief Complaint: Rash to neck, back. "Looks like eczema." Started amlodipine recently. In office 02/27/22. Symptoms: Itchy Frequency: Started Monday Pertinent Negatives: Patient denies fever Disposition: '[]'$ ED /'[]'$ Urgent Care (no appt availability in office) / '[x]'$ Appointment(In office/virtual)/ '[]'$  [facility]/ '[]'$ Home Care/ '[]'$ Refused Recommended Disposition /'[]'$ [facility]/ '[]'$  Follow-up with PCP Additional Notes:   Answer Assessment - Initial Assessment Questions 1. APPEARANCE of RASH: "Describe the rash." (e.g., spots, blisters, raised areas, skin peeling, scaly)     Looks like eczema  2. SIZE: "How big are the spots?" (e.g., tip of pen, eraser, coin; inches, centimeters)     2-3 Inches 3. LOCATION: "Where is the rash located?"     Neck, back 4. COLOR: "What color is the rash?" (Note: It is difficult to assess rash color in people with darker-colored skin. When this situation occurs, simply ask the caller to describe what they see.)     Dark 5. ONSET: "When did the rash begin?"     Monday 6. FEVER: "Do you have a fever?" If Yes, ask: "What is your temperature, how was it measured, and when did it start?"     No 7. ITCHING: "Does the rash itch?" If Yes, ask: "How bad is the itch?" (Scale 1-10; or mild, moderate, severe)     Severe 8. CAUSE: "What do you think is causing the rash?"     Medication 9. NEW MEDICINES: "What new medicines are you taking?" (e.g., name of antibiotic) "When did you start taking this medication?".     Amlodipine 10. OTHER SYMPTOMS: "Do you have any other symptoms?" (e.g., sore throat, fever, joint pain)       No 11. PREGNANCY: "Is there any chance you are pregnant?" "When was your last menstrual period?"       N/a  Protocols used: Rash - Widespread On Drugs-A-AH

## 2022-03-16 ENCOUNTER — Encounter: Payer: Self-pay | Admitting: Physician Assistant

## 2022-03-16 ENCOUNTER — Ambulatory Visit: Payer: 59 | Admitting: Physician Assistant

## 2022-03-16 VITALS — BP 118/71 | HR 63 | Temp 98.8°F | Ht 71.58 in | Wt 223.4 lb

## 2022-03-16 DIAGNOSIS — R21 Rash and other nonspecific skin eruption: Secondary | ICD-10-CM

## 2022-03-16 DIAGNOSIS — I10 Essential (primary) hypertension: Secondary | ICD-10-CM | POA: Diagnosis not present

## 2022-03-16 NOTE — Patient Instructions (Addendum)
Please stop taking the Amlodipine   Start taking the Allegra again to help the allergic reaction to resolve Please review the included videos so we can discuss the next medication to try

## 2022-03-16 NOTE — Progress Notes (Signed)
Acute Office Visit   Patient: Richard Rodgers   DOB: [birth year]   61 y.o. Male  MRN: [id number] Visit Date: 03/16/2022  Today's healthcare provider: Dani Gobble Simuel Stebner, PA-C  Introduced myself to the patient as a Journalist, newspaper and provided education on APPs in clinical practice.    Chief Complaint  Patient presents with   Rash    Started Monday on Neck, back and chest.  Patient started a new bp med on 03/06/22 and believes this is a reaction form the medication.    Subjective    Rash   HPI     Rash    Additional comments: Started Monday on Neck, back and chest.  Patient started a new bp med on 03/06/22 and believes this is a reaction form the medication.       Last edited by Jerelene Redden, CMA on 03/16/2022 11:27 AM.       Reports he developed a rash on Sunday and became more prevalent on Monday States his skin started feeling tight and dry States he has a tingling sensation in his ankles and lower legs Reports rash is present on chest, neck, back,  Reports rash is itchy and under pectorals it burns a bit He denies new soaps, lotions, detergents, new foods,  Reports feeling like he is "dried out"   States the only new thing he has added is the Amlodipine     Medications: Outpatient Medications Prior to Visit  Medication Sig Note   fexofenadine (ALLEGRA ALLERGY) 180 MG tablet Take 1 tablet (180 mg total) by mouth daily.    folic acid (FOLVITE) 542 MCG tablet Take 400 mcg by mouth daily.    Multiple Vitamin (MULTIVITAMIN) tablet Take 1 tablet by mouth daily.    Omega-3 Fatty Acids (FISH OIL) 1000 MG CAPS Take 1,000 mg by mouth daily.    Plant Sterols and Stanols (CHOLESTOFF PO) Take 900 mcg by mouth 2 (two) times daily.    Potassium 99 MG TABS Take 1 tablet by mouth daily.    vitamin B-12 (CYANOCOBALAMIN) 500 MCG tablet Take 500 mcg by mouth daily.    [DISCONTINUED] amLODipine (NORVASC) 5 MG tablet Take 1 tablet (5 mg total) by mouth daily. 03/16/2022: rash and dryness    No facility-administered medications prior to visit.    Review of Systems  Cardiovascular:  Negative for chest pain, palpitations and leg swelling.  Skin:  Positive for rash.  Neurological:  Negative for headaches.       Objective    BP 118/71   Pulse 63   Temp 98.8 F (37.1 C) (Oral)   Ht 5' 11.58" (1.818 m)   Wt 223 lb 6.4 oz (101.3 kg)   SpO2 96%   BMI 30.66 kg/m    Physical Exam Vitals reviewed.  Constitutional:      General: He is awake.     Appearance: Normal appearance. He is well-developed, well-groomed and overweight.  Cardiovascular:     Rate and Rhythm: Normal rate and regular rhythm.     Pulses: Normal pulses.          Radial pulses are 2+ on the right side and 2+ on the left side.     Heart sounds: Normal heart sounds. No murmur heard.    No friction rub. No gallop.  Pulmonary:     Effort: Pulmonary effort is normal.     Breath sounds: Normal breath sounds. No decreased air movement. No decreased  breath sounds, wheezing, rhonchi or rales.  Musculoskeletal:     Right lower leg: No edema.     Left lower leg: No edema.  Neurological:     Mental Status: He is alert.  Psychiatric:        Attention and Perception: Attention and perception normal.        Mood and Affect: Mood and affect normal.        Speech: Speech normal.        Behavior: Behavior normal. Behavior is cooperative.       No results found for any visits on 03/16/22.  Assessment & Plan      Return in about 2 weeks (around 03/30/2022) for HTN management .         Problem List Items Addressed This Visit       Cardiovascular and Mediastinum   Primary hypertension    Patient was started on Amlodipine for HTN  Reports feeling well and noticing gradual decrease in home BP measures States he noticed improvement in exercise endurance while taking  Reports noticing a rash developing around Sunday that is itchy and disseminated across his neck, chest and back Suspect allergic  reaction to amlodipine so will dc today Provided information about ARBs, ACEi, and HCTZ for him to review so we can discuss which would be a preferable replacement at follow up in ~2 weeks.       Other Visit Diagnoses     Rash in adult    -  Primary Acute, new concern Patient was recently started on Amlodipine. He states this is the only recent change he is aware of Rash started on Sunday after taking Amlodipine for about a week Suspect this may be allergic reaction Recommend dc Amlodipine Will wait for rash to clear before starting new HTN medication Follow up in 2 weeks         Return in about 2 weeks (around 03/30/2022) for HTN management .   I, Khaden Gater E Adilson Grafton, PA-C, have reviewed all documentation for this visit. The documentation on 03/16/22 for the exam, diagnosis, procedures, and orders are all accurate and complete.   Talitha Givens, MHS, PA-C [facility]

## 2022-03-16 NOTE — Assessment & Plan Note (Signed)
Patient was started on Amlodipine for HTN  Reports feeling well and noticing gradual decrease in home BP measures States he noticed improvement in exercise endurance while taking  Reports noticing a rash developing around Sunday that is itchy and disseminated across his neck, chest and back Suspect allergic reaction to amlodipine so will dc today Provided information about ARBs, ACEi, and HCTZ for him to review so we can discuss which would be a preferable replacement at follow up in ~2 weeks.

## 2022-03-28 ENCOUNTER — Ambulatory Visit: Payer: 59 | Admitting: Physician Assistant

## 2022-03-28 ENCOUNTER — Encounter: Payer: Self-pay | Admitting: Physician Assistant

## 2022-03-28 ENCOUNTER — Encounter: Payer: Self-pay | Admitting: Family

## 2022-03-28 VITALS — BP 134/77 | HR 71 | Temp 98.0°F | Wt 224.2 lb

## 2022-03-28 DIAGNOSIS — L918 Other hypertrophic disorders of the skin: Secondary | ICD-10-CM | POA: Diagnosis not present

## 2022-03-28 DIAGNOSIS — L309 Dermatitis, unspecified: Secondary | ICD-10-CM | POA: Diagnosis not present

## 2022-03-28 DIAGNOSIS — R21 Rash and other nonspecific skin eruption: Secondary | ICD-10-CM | POA: Diagnosis not present

## 2022-03-28 DIAGNOSIS — L538 Other specified erythematous conditions: Secondary | ICD-10-CM | POA: Diagnosis not present

## 2022-03-28 NOTE — Progress Notes (Signed)
Established Patient Office Visit  Name: Richard Rodgers   MRN: [id number]    DOB: 10/25/1960   Date:03/28/2022  Today's Provider: Talitha Givens, MHS, PA-C Introduced myself to the patient as a PA-C and provided education on APPs in clinical practice.         Subjective  Chief Complaint  Chief Complaint  Patient presents with   Diabetes    Eye exam requested from [place]   Hypertension    Diabetes  Hypertension    Hypertension: - Medications: Previously on Amlodipine but developed allergy - rash and itching  - Compliance: was excellent while taking  - Checking BP at home:  - Denies any SOB, CP, vision changes, LE edema, medication SEs, or symptoms of hypotension    Rash Appears to have gotten worse since previous visit  Reports he is still having itching and he feels very dry  Dry skin and dry eyes  Reports feeling scaley all over- particularly around eyes, neck elbows, backs of knees and inner thighs Denies new exposures at work, new detergents, new washes  Has tried lidocaine ointment but found this made the dryness much worse  Has made an apt with Derm to see what this is - [facility]    Patient Active Problem List   Diagnosis Date Noted   Primary hypertension 02/27/2022   Diabetes mellitus without complication ([place]) 72/82/0601   Prediabetes 01/30/2022   Decreased libido 01/30/2022   Hyperlipidemia 02/06/2018   Screening for colon cancer    Polyp of sigmoid colon     Past Surgical History:  Procedure Laterality Date   COLONOSCOPY     COLONOSCOPY WITH PROPOFOL N/A 01/07/2018   Procedure: COLONOSCOPY WITH PROPOFOL;  Surgeon: Virgel Manifold, MD;  Location: [facility] ENDOSCOPY;  Service: Endoscopy;  Laterality: N/A;   CYST REMOVAL LEG Left    Knee   VASECTOMY      Family History  Problem Relation Age of Onset   Diabetes Mother    Hypertension Mother    Stroke Father    Lupus Sister    Diabetes Brother    Diabetes Maternal  Grandmother     Social History   Tobacco Use   Smoking status: Never   Smokeless tobacco: Never  Substance Use Topics   Alcohol use: Never     Current Outpatient Medications:    folic acid (FOLVITE) 561 MCG tablet, Take 400 mcg by mouth daily., Disp: , Rfl:    Multiple Vitamin (MULTIVITAMIN) tablet, Take 1 tablet by mouth daily., Disp: , Rfl:    Omega-3 Fatty Acids (FISH OIL) 1000 MG CAPS, Take 1,000 mg by mouth daily., Disp: , Rfl:    Plant Sterols and Stanols (CHOLESTOFF PO), Take 900 mcg by mouth 2 (two) times daily., Disp: , Rfl:    Potassium 99 MG TABS, Take 1 tablet by mouth daily., Disp: , Rfl:    vitamin B-12 (CYANOCOBALAMIN) 500 MCG tablet, Take 500 mcg by mouth daily., Disp: , Rfl:   Allergies  Allergen Reactions   Amlodipine Rash    I personally reviewed active problem list, medication list, allergies, notes from last encounter, lab results with the patient/caregiver today.   Review of Systems  Skin:  Positive for itching and rash.      Objective  Vitals:   03/28/22 0826 03/28/22 0831  BP: (!) 148/75 134/77  Pulse: 69 71  Temp: 98 F (36.7 C)   TempSrc: Oral   SpO2:  96%   Weight: 224 lb 3.2 oz (101.7 kg)     Body mass index is 30.77 kg/m.  Physical Exam Vitals reviewed.  Constitutional:      General: He is awake.     Appearance: Normal appearance. He is well-developed and well-groomed.  HENT:     Head: Normocephalic and atraumatic.  Eyes:     General: Gaze aligned appropriately. No allergic shiner or scleral icterus.    Extraocular Movements: Extraocular movements intact.     Conjunctiva/sclera: Conjunctivae normal.  Pulmonary:     Effort: Pulmonary effort is normal.  Musculoskeletal:     Cervical back: Normal range of motion and neck supple.  Skin:    General: Skin is warm.     Findings: Rash present. Rash is scaling.     Comments: Silvery, scaling rash along neck, anterior crease of elbow   Neurological:     Mental Status: He is  alert.  Psychiatric:        Behavior: Behavior is cooperative.      Recent Results (from the past 2160 hour(s))  HgB A1c     Status: Abnormal   Collection Time: 01/30/22  9:05 AM  Result Value Ref Range   Hgb A1c MFr Bld 6.8 (H) 4.8 - 5.6 %    Comment:          Prediabetes: 5.7 - 6.4          Diabetes: >6.4          Glycemic control for adults with diabetes: <7.0    Est. average glucose Bld gHb Est-mCnc 148 mg/dL  CBC w/Diff     Status: Abnormal   Collection Time: 01/30/22  9:05 AM  Result Value Ref Range   WBC 4.7 3.4 - 10.8 x10E3/uL   RBC 4.08 (L) 4.14 - 5.80 x10E6/uL   Hemoglobin 12.8 (L) 13.0 - 17.7 g/dL   Hematocrit 37.5 37.5 - 51.0 %   MCV 92 79 - 97 fL   MCH 31.4 26.6 - 33.0 pg   MCHC 34.1 31.5 - 35.7 g/dL   RDW 14.2 11.6 - 15.4 %   Platelets 195 150 - 450 x10E3/uL   Neutrophils 52 Not Estab. %   Lymphs 36 Not Estab. %   Monocytes 8 Not Estab. %   Eos 3 Not Estab. %   Basos 1 Not Estab. %   Neutrophils Absolute 2.5 1.4 - 7.0 x10E3/uL   Lymphocytes Absolute 1.7 0.7 - 3.1 x10E3/uL   Monocytes Absolute 0.4 0.1 - 0.9 x10E3/uL   EOS (ABSOLUTE) 0.1 0.0 - 0.4 x10E3/uL   Basophils Absolute 0.0 0.0 - 0.2 x10E3/uL   Immature Granulocytes 0 Not Estab. %   Immature Grans (Abs) 0.0 0.0 - 0.1 x10E3/uL  Comp Met (CMET)     Status: Abnormal   Collection Time: 01/30/22  9:05 AM  Result Value Ref Range   Glucose 119 (H) 70 - 99 mg/dL   BUN 14 8 - 27 mg/dL   Creatinine, Ser 1.41 (H) 0.76 - 1.27 mg/dL   eGFR 57 (L) >59 mL/min/1.73   BUN/Creatinine Ratio 10 10 - 24   Sodium 142 134 - 144 mmol/L   Potassium 4.6 3.5 - 5.2 mmol/L   Chloride 106 96 - 106 mmol/L   CO2 24 20 - 29 mmol/L   Calcium 9.2 8.6 - 10.2 mg/dL   Total Protein 7.1 6.0 - 8.5 g/dL   Albumin 4.2 3.8 - 4.8 g/dL   Globulin, Total 2.9 1.5 - 4.5 g/dL  Albumin/Globulin Ratio 1.4 1.2 - 2.2   Bilirubin Total <0.2 0.0 - 1.2 mg/dL   Alkaline Phosphatase 106 44 - 121 IU/L   AST 31 0 - 40 IU/L   ALT 39 0 - 44 IU/L   TSH     Status: None   Collection Time: 01/30/22  9:05 AM  Result Value Ref Range   TSH 3.560 0.450 - 4.500 uIU/mL  PSA     Status: None   Collection Time: 01/30/22  9:05 AM  Result Value Ref Range   Prostate Specific Ag, Serum 0.3 0.0 - 4.0 ng/mL    Comment: Roche ECLIA methodology. According to the American Urological Association, Serum PSA should decrease and remain at undetectable levels after radical prostatectomy. The AUA defines biochemical recurrence as an initial PSA value 0.2 ng/mL or greater followed by a subsequent confirmatory PSA value 0.2 ng/mL or greater. Values obtained with different assay methods or kits cannot be used interchangeably. Results cannot be interpreted as absolute evidence of the presence or absence of malignant disease.   Lipid Profile     Status: Abnormal   Collection Time: 01/30/22  9:05 AM  Result Value Ref Range   Cholesterol, Total 165 100 - 199 mg/dL   Triglycerides 187 (H) 0 - 149 mg/dL   HDL 29 (L) >39 mg/dL   VLDL Cholesterol Cal 33 5 - 40 mg/dL   LDL Chol Calc (NIH) 103 (H) 0 - 99 mg/dL   Chol/HDL Ratio 5.7 (H) 0.0 - 5.0 ratio    Comment:                                   T. Chol/HDL Ratio                                             Men  Women                               1/2 Avg.Risk  3.4    3.3                                   Avg.Risk  5.0    4.4                                2X Avg.Risk  9.6    7.1                                3X Avg.Risk 23.4   11.0   Testosterone     Status: Abnormal   Collection Time: 01/30/22  9:05 AM  Result Value Ref Range   Testosterone 222 (L) 264 - 916 ng/dL    Comment: Adult male reference interval is based on a population of healthy nonobese males (BMI <30) between 79 and 58 years old. [place], [place] [id number]. PMID: 21308657.      PHQ2/9:    03/28/2022    8:31 AM 02/27/2022    9:28 AM 01/30/2022    9:21 AM 01/24/2021    8:31 AM 01/27/2019  9:22 AM  Depression screen PHQ  2/9  Decreased Interest 0 0 0 0 0  Down, Depressed, Hopeless 0 0 0 0 0  PHQ - 2 Score 0 0 0 0 0  Altered sleeping 0 0 0 0 0  Tired, decreased energy 0 0 0 0 0  Change in appetite 0 0 0 0 0  Feeling bad or failure about yourself  0 0 0 0 0  Trouble concentrating 0 0 0 0 0  Moving slowly or fidgety/restless 0 0 0 0 0  Suicidal thoughts 0 0 0 0 0  PHQ-9 Score 0 0 0 0 0  Difficult doing work/chores Not difficult at all Not difficult at all Not difficult at all  Not difficult at all      Fall Risk:    03/28/2022    8:30 AM 02/27/2022    9:28 AM 01/30/2022    9:21 AM 02/11/2021   11:14 AM 01/24/2021    8:31 AM  Fall Risk   Falls in the past year? 0 0 0 0 0  Number falls in past yr: 0 0 0 0 0  Injury with Fall? 0 0 0 0 0  Risk for fall due to : No Fall Risks No Fall Risks No Fall Risks No Fall Risks No Fall Risks  Follow up Falls evaluation completed Falls evaluation completed Falls evaluation completed Falls evaluation completed Falls evaluation completed      Functional Status Survey:      Assessment & Plan  Problem List Items Addressed This Visit   None Visit Diagnoses     Rash in adult    -  Primary Acute, new concern Reports rash has gotten more itchy and seems to have spread to other flexural areas Rash appears overall scaly in flexural areas Suspect eczema at this time- pt has apt with Derm later today- will defer to them for dx  Reviewed management principles for rash and eczema- focus on skin moisture and barrier maintenance, reduced temp baths/showers, gentle fragrance-free, dye-free soaps and detergents  Recommend Aquaphor/Vaseline for skin moisturizing  Will wait for rash to clear up prior to trying new HTN medications Follow up in 4 weeks for HTN management         Return in about 4 weeks (around 04/25/2022) for HTN, Diabetes follow up.   I, Burlon Centrella E Mechele Kittleson, PA-C, have reviewed all documentation for this visit. The documentation on 03/28/22 for the exam,  diagnosis, procedures, and orders are all accurate and complete.   Talitha Givens, MHS, PA-C [facility]

## 2022-03-28 NOTE — Patient Instructions (Signed)
Try to use gentle, dye-free, fragrance-free soaps and hypoallergenic detergents I prefer Cetaphil products for soaps and facial washes   Make sure you are keeping your skin hydrated- I prefer Aquaphor and Vaseline for things like this  Try to take lower temperature showers and baths- hot water can aggravate this

## 2022-03-31 ENCOUNTER — Encounter: Payer: Self-pay | Admitting: *Deleted

## 2022-03-31 ENCOUNTER — Encounter: Payer: 59 | Attending: Physician Assistant | Admitting: *Deleted

## 2022-03-31 VITALS — BP 128/80 | Ht 73.0 in | Wt 223.6 lb

## 2022-03-31 DIAGNOSIS — Z713 Dietary counseling and surveillance: Secondary | ICD-10-CM | POA: Diagnosis not present

## 2022-03-31 DIAGNOSIS — E119 Type 2 diabetes mellitus without complications: Secondary | ICD-10-CM | POA: Insufficient documentation

## 2022-03-31 NOTE — Progress Notes (Signed)
Diabetes Self-Management Education  Visit Type: First/Initial  Appt. Start Time: 0830 Appt. End Time: 0935  03/31/2022  [name], identified by name and date of birth, is a 61 y.o. male with a diagnosis of Diabetes: Type 2.   ASSESSMENT  Blood pressure 128/80, height '6\' 1"'$  (1.854 m), weight 223 lb 9.6 oz (101.4 kg). Body mass index is 29.5 kg/m.   Diabetes Self-Management Education - 03/31/22 1437       Visit Information   Visit Type First/Initial      Initial Visit   Diabetes Type Type 2    Date Diagnosed 20+ years    Are you currently following a meal plan? No    Are you taking your medications as prescribed? Yes      Health Coping   How would you rate your overall health? Excellent      Psychosocial Assessment   Patient Belief/Attitude about Diabetes Other (comment)   "fine"   What is the hardest part about your diabetes right now, causing you the most concern, or is the most worrisome to you about your diabetes?   Making healty food and beverage choices    Self-care barriers None    Self-management support Doctor's office;Family    Patient Concerns Nutrition/Meal planning;Glycemic Control;Monitoring    Special Needs None    Preferred Learning Style Auditory;Visual;Hands on    [place] in progress    How often do you need to have someone help you when you read instructions, pamphlets, or other written materials from your doctor or pharmacy? 1 - Never    What is the last grade level you completed in school? high school      Pre-Education Assessment   Patient understands the diabetes disease and treatment process. Needs Instruction    Patient understands incorporating nutritional management into lifestyle. Needs Instruction    Patient undertands incorporating physical activity into lifestyle. Demonstrates understanding / competency    Patient understands using medications safely. Needs Instruction    Patient understands monitoring blood  glucose, interpreting and using results Needs Instruction    Patient understands prevention, detection, and treatment of acute complications. Needs Instruction    Patient understands prevention, detection, and treatment of chronic complications. Needs Instruction    Patient understands how to develop strategies to address psychosocial issues. Needs Instruction    Patient understands how to develop strategies to promote health/change behavior. Needs Instruction      Complications   Last HgB A1C per patient/outside source 6.8 %   01/30/2022   How often do you check your blood sugar? Patient declines   BG in the office was 115 mg/dL at 9:25 am - fasting.   Have you had a dilated eye exam in the past 12 months? Yes    Have you had a dental exam in the past 12 months? Yes    Are you checking your feet? Yes    How many days per week are you checking your feet? 7      Dietary Intake   Breakfast watermelon and ginger smoothie with protein powder; fruit (apple, banana, grapes) rare bagel    Snack (morning) reports no snacks during the day    Lunch chcken salad; spaghetti; [place] and cheese lettuce wrap with fruit    Dinner chicken, fish, occasional beef and pork; sweet potatoes, peas, beans, corn, occasional rice and pasta, broccoli, green beans, greens, tomatoes, squash, zucchini    Snack (evening) sometimes cereal and milk    Beverage(s)  water, juice, Crystal light type drinks with no sugar      Activity / Exercise   Activity / Exercise Type Light (walking / raking leaves)    How many days per week do you exercise? 6    How many minutes per day do you exercise? 60    Total minutes per week of exercise 360      Patient Education   Previous Diabetes Education Yes (please comment)   several years ago at a local church   Disease Pathophysiology Definition of diabetes, type 1 and 2, and the diagnosis of diabetes;Factors that contribute to the development of diabetes    Healthy Eating Role of diet  in the treatment of diabetes and the relationship between the three main macronutrients and blood glucose level;Food label reading, portion sizes and measuring food.;Carbohydrate counting    Being Active Role of exercise on diabetes management, blood pressure control and cardiac health.    Monitoring Identified appropriate SMBG and/or A1C goals.    Chronic complications Relationship between chronic complications and blood glucose control    Diabetes Stress and Support Identified and addressed patients feelings and concerns about diabetes      Individualized Goals (developed by patient)   Reducing Risk Other (comment)   improve blood sugars, prevent diabetes complications, become more fit     Outcomes   Expected Outcomes Demonstrated interest in learning. Expect positive outcomes    Future DMSE 4-6 wks         Individualized Plan for Diabetes Self-Management Training:   Learning Objective:  Patient will have a greater understanding of diabetes self-management. Patient education plan is to attend individual and/or group sessions per assessed needs and concerns.   Plan:   Patient Instructions  Exercise: Continue program for    45-90  minutes   6  days a week  Eat 3 meals day,   1-2  snacks a day Space meals 4-6 hours apart Include 1 serving of protein when having fruit for a snack Avoid sugar sweetened drinks (juices)  Complete 3 Day Food Record and bring to next appt  Return for appointment on:  Friday April 28, 2022 at 8:30 am with Freda Munro (nurse)   Expected Outcomes:  Demonstrated interest in learning. Expect positive outcomes  Education material provided:  General Meal Planning Guidelines Simple Meal Plan 3 Day Food Record Healthy Snack Choices (ADA) Carb-Mindful Smoothie Handout  If problems or questions, patient to contact team via:   [name], RN, [place] [phone]  Future DSME appointment: 4-6 wks April 28, 2022 with this educator

## 2022-03-31 NOTE — Patient Instructions (Signed)
Exercise: Continue program for    45-90  minutes   6  days a week  Eat 3 meals day,   1-2  snacks a day Space meals 4-6 hours apart Include 1 serving of protein when having fruit for a snack Avoid sugar sweetened drinks (juices)  Complete 3 Day Food Record and bring to next appt  Return for appointment on:  Friday April 28, 2022 at 8:30 am with Freda Munro (nurse)

## 2022-04-20 ENCOUNTER — Other Ambulatory Visit: Payer: Self-pay | Admitting: Physician Assistant

## 2022-04-20 DIAGNOSIS — I10 Essential (primary) hypertension: Secondary | ICD-10-CM

## 2022-04-21 NOTE — Telephone Encounter (Signed)
Unable to refill per protocol, Rx expired. Medication was discontinued 810/23 by PCP due to allergic reaction. Will refuse request.  Requested Prescriptions  Pending Prescriptions Disp Refills  . amLODipine (NORVASC) 5 MG tablet [Pharmacy Med Name: AMLODIPINE BESYLATE 5 MG TAB] 60 tablet 0    Sig: TAKE 1 TABLET (5 MG TOTAL) BY MOUTH DAILY.     Cardiovascular: Calcium Channel Blockers 2 Passed - 04/20/2022  2:32 PM      Passed - Last BP in normal range    BP Readings from Last 1 Encounters:  03/31/22 128/80         Passed - Last Heart Rate in normal range    Pulse Readings from Last 1 Encounters:  03/28/22 71         Passed - Valid encounter within last 6 months    Recent Outpatient Visits          3 weeks ago Rash in adult   [facility] Mecum, Erin E, PA-C   1 month ago Rash in adult   [facility], Erin E, PA-C   1 month ago Diabetes mellitus without complication ([place])   [facility] Mecum, Dani Gobble, PA-C   2 months ago Annual physical exam   [facility] Mecum, Dani Gobble, PA-C   1 year ago Elevated lipoprotein(a)   [place], MD      Future Appointments            In 6 days Mecum, Dani Gobble, PA-C [employer]

## 2022-04-27 ENCOUNTER — Ambulatory Visit: Payer: 59 | Admitting: Physician Assistant

## 2022-04-27 ENCOUNTER — Encounter: Payer: Self-pay | Admitting: Physician Assistant

## 2022-04-27 VITALS — BP 130/77 | HR 76 | Temp 98.7°F | Ht 72.99 in | Wt 224.8 lb

## 2022-04-27 DIAGNOSIS — Z23 Encounter for immunization: Secondary | ICD-10-CM | POA: Diagnosis not present

## 2022-04-27 DIAGNOSIS — I10 Essential (primary) hypertension: Secondary | ICD-10-CM | POA: Diagnosis not present

## 2022-04-27 DIAGNOSIS — R6882 Decreased libido: Secondary | ICD-10-CM

## 2022-04-27 MED ORDER — VALSARTAN 40 MG PO TABS: 40.0000 mg | ORAL_TABLET | Freq: Every day | ORAL | 1 refills | Status: DC

## 2022-04-27 NOTE — Progress Notes (Signed)
Established Patient Office Visit  Name: Richard Rodgers   MRN: [id number]    DOB: June 12, 1961   Date:04/27/2022  Today's Provider: Talitha Givens, MHS, PA-C Introduced myself to the patient as a PA-C and provided education on APPs in clinical practice.         Subjective  Chief Complaint  Chief Complaint  Patient presents with   Hypertension   Diabetes    HPI  Hypertension: - Medications: none at the moment - Compliance: Excellent - Checking BP at home: He is checking at home- daily. Highest 142/80 avg typically in 130s/ 80s  - Denies any SOB, CP, vision changes, LE edema, medication SEs, or symptoms of hypotension Exercise: has been doing Cardio with resistance bands and running/ speed walking about 5 miles per day everyday and does weights at the gym several times per week.  He's been doing this for a few years now.   Reports he has tried to reduce the stress in his life and thinks this is helping  Has been incorporating meditation    Rash has cleared up  Pt saw Dermatology and they believe this was related to exposure Derm thinks this may have been related to shower gel use - he has since changed per their recommendations  He tried restarting the Amlodipine after rash cleared and developed redness on chest   Patient Active Problem List   Diagnosis Date Noted   Primary hypertension 02/27/2022   Diabetes mellitus without complication ([place]) 60/63/0160   Prediabetes 01/30/2022   Decreased libido 01/30/2022   Hyperlipidemia 02/06/2018   Screening for colon cancer    Polyp of sigmoid colon     Past Surgical History:  Procedure Laterality Date   COLONOSCOPY     COLONOSCOPY WITH PROPOFOL N/A 01/07/2018   Procedure: COLONOSCOPY WITH PROPOFOL;  Surgeon: Virgel Manifold, MD;  Location: [facility] ENDOSCOPY;  Service: Endoscopy;  Laterality: N/A;   CYST REMOVAL LEG Left    Knee   VASECTOMY      Family History  Problem Relation Age of Onset   Diabetes Mother     Hypertension Mother    Stroke Father    Lupus Sister    Diabetes Brother    Diabetes Maternal Grandmother     Social History   Tobacco Use   Smoking status: Never   Smokeless tobacco: Never  Substance Use Topics   Alcohol use: Never     Current Outpatient Medications:    folic acid (FOLVITE) 109 MCG tablet, Take 800 mcg by mouth daily., Disp: , Rfl:    hydrocortisone 2.5 % ointment, 2 (two) times daily., Disp: , Rfl:    Magnesium Citrate 100 MG CAPS, Take 1 capsule by mouth daily., Disp: , Rfl:    Misc Natural Products (YUMVS BEET ROOT-TART CHERRY PO), Take 2,000 mg by mouth daily., Disp: , Rfl:    Omega-3 Fatty Acids (FISH OIL) 1000 MG CAPS, Take 1,000 mg by mouth daily., Disp: , Rfl:    triamcinolone ointment (KENALOG) 0.1 %, Apply topically 2 (two) times daily., Disp: , Rfl:    zinc gluconate 50 MG tablet, Take 50 mg by mouth daily., Disp: , Rfl:   Allergies  Allergen Reactions   Amlodipine Rash    I personally reviewed active problem list, medication list, allergies, health maintenance, notes from last encounter, lab results with the patient/caregiver today.   Review of Systems  Eyes:  Negative for blurred vision and double vision.  Respiratory:  Negative for cough, shortness of breath and wheezing.   Cardiovascular:  Negative for chest pain, palpitations and leg swelling.  Neurological:  Negative for dizziness and headaches.      Objective  Vitals:   04/27/22 0927  BP: 130/77  Pulse: 76  Temp: 98.7 F (37.1 C)  TempSrc: Oral  SpO2: 95%  Weight: 224 lb 12.8 oz (102 kg)  Height: 6' 0.99" (1.854 m)    Body mass index is 29.67 kg/m.  Physical Exam Vitals reviewed.  Constitutional:      General: He is awake.     Appearance: Normal appearance. He is well-developed, well-groomed and overweight.  Cardiovascular:     Rate and Rhythm: Normal rate and regular rhythm.     Pulses: Normal pulses.          Radial pulses are 2+ on the right side and 2+ on the  left side.     Heart sounds: Normal heart sounds. No murmur heard.    No friction rub. No gallop.  Pulmonary:     Effort: Pulmonary effort is normal.     Breath sounds: Normal breath sounds and air entry. No decreased breath sounds, wheezing, rhonchi or rales.  Musculoskeletal:     Right lower leg: No edema.     Left lower leg: No edema.  Neurological:     Mental Status: He is alert.  Psychiatric:        Attention and Perception: Attention and perception normal.        Mood and Affect: Mood and affect normal.        Speech: Speech normal.        Behavior: Behavior normal. Behavior is cooperative.        Thought Content: Thought content normal.        Cognition and Memory: Cognition normal.        Judgment: Judgment normal.      Recent Results (from the past 2160 hour(s))  HgB A1c     Status: Abnormal   Collection Time: 01/30/22  9:05 AM  Result Value Ref Range   Hgb A1c MFr Bld 6.8 (H) 4.8 - 5.6 %    Comment:          Prediabetes: 5.7 - 6.4          Diabetes: >6.4          Glycemic control for adults with diabetes: <7.0    Est. average glucose Bld gHb Est-mCnc 148 mg/dL  CBC w/Diff     Status: Abnormal   Collection Time: 01/30/22  9:05 AM  Result Value Ref Range   WBC 4.7 3.4 - 10.8 x10E3/uL   RBC 4.08 (L) 4.14 - 5.80 x10E6/uL   Hemoglobin 12.8 (L) 13.0 - 17.7 g/dL   Hematocrit 37.5 37.5 - 51.0 %   MCV 92 79 - 97 fL   MCH 31.4 26.6 - 33.0 pg   MCHC 34.1 31.5 - 35.7 g/dL   RDW 14.2 11.6 - 15.4 %   Platelets 195 150 - 450 x10E3/uL   Neutrophils 52 Not Estab. %   Lymphs 36 Not Estab. %   Monocytes 8 Not Estab. %   Eos 3 Not Estab. %   Basos 1 Not Estab. %   Neutrophils Absolute 2.5 1.4 - 7.0 x10E3/uL   Lymphocytes Absolute 1.7 0.7 - 3.1 x10E3/uL   Monocytes Absolute 0.4 0.1 - 0.9 x10E3/uL   EOS (ABSOLUTE) 0.1 0.0 - 0.4 x10E3/uL   Basophils Absolute 0.0 0.0 -  0.2 x10E3/uL   Immature Granulocytes 0 Not Estab. %   Immature Grans (Abs) 0.0 0.0 - 0.1 x10E3/uL  Comp  Met (CMET)     Status: Abnormal   Collection Time: 01/30/22  9:05 AM  Result Value Ref Range   Glucose 119 (H) 70 - 99 mg/dL   BUN 14 8 - 27 mg/dL   Creatinine, Ser 1.41 (H) 0.76 - 1.27 mg/dL   eGFR 57 (L) >59 mL/min/1.73   BUN/Creatinine Ratio 10 10 - 24   Sodium 142 134 - 144 mmol/L   Potassium 4.6 3.5 - 5.2 mmol/L   Chloride 106 96 - 106 mmol/L   CO2 24 20 - 29 mmol/L   Calcium 9.2 8.6 - 10.2 mg/dL   Total Protein 7.1 6.0 - 8.5 g/dL   Albumin 4.2 3.8 - 4.8 g/dL   Globulin, Total 2.9 1.5 - 4.5 g/dL   Albumin/Globulin Ratio 1.4 1.2 - 2.2   Bilirubin Total <0.2 0.0 - 1.2 mg/dL   Alkaline Phosphatase 106 44 - 121 IU/L   AST 31 0 - 40 IU/L   ALT 39 0 - 44 IU/L  TSH     Status: None   Collection Time: 01/30/22  9:05 AM  Result Value Ref Range   TSH 3.560 0.450 - 4.500 uIU/mL  PSA     Status: None   Collection Time: 01/30/22  9:05 AM  Result Value Ref Range   Prostate Specific Ag, Serum 0.3 0.0 - 4.0 ng/mL    Comment: Roche ECLIA methodology. According to the American Urological Association, Serum PSA should decrease and remain at undetectable levels after radical prostatectomy. The AUA defines biochemical recurrence as an initial PSA value 0.2 ng/mL or greater followed by a subsequent confirmatory PSA value 0.2 ng/mL or greater. Values obtained with different assay methods or kits cannot be used interchangeably. Results cannot be interpreted as absolute evidence of the presence or absence of malignant disease.   Lipid Profile     Status: Abnormal   Collection Time: 01/30/22  9:05 AM  Result Value Ref Range   Cholesterol, Total 165 100 - 199 mg/dL   Triglycerides 187 (H) 0 - 149 mg/dL   HDL 29 (L) >39 mg/dL   VLDL Cholesterol Cal 33 5 - 40 mg/dL   LDL Chol Calc (NIH) 103 (H) 0 - 99 mg/dL   Chol/HDL Ratio 5.7 (H) 0.0 - 5.0 ratio    Comment:                                   T. Chol/HDL Ratio                                             Men  Women                                1/2 Avg.Risk  3.4    3.3                                   Avg.Risk  5.0    4.4  2X Avg.Risk  9.6    7.1                                3X Avg.Risk 23.4   11.0   Testosterone     Status: Abnormal   Collection Time: 01/30/22  9:05 AM  Result Value Ref Range   Testosterone 222 (L) 264 - 916 ng/dL    Comment: Adult male reference interval is based on a population of healthy nonobese males (BMI <30) between 92 and 61 years old. [place], [place] [phone]. PMID: [id number].      PHQ2/9:    03/31/2022    8:41 AM 03/28/2022    8:31 AM 02/27/2022    9:28 AM 01/30/2022    9:21 AM 01/24/2021    8:31 AM  Depression screen PHQ 2/9  Decreased Interest 0 0 0 0 0  Down, Depressed, Hopeless 0 0 0 0 0  PHQ - 2 Score 0 0 0 0 0  Altered sleeping  0 0 0 0  Tired, decreased energy  0 0 0 0  Change in appetite  0 0 0 0  Feeling bad or failure about yourself   0 0 0 0  Trouble concentrating  0 0 0 0  Moving slowly or fidgety/restless  0 0 0 0  Suicidal thoughts  0 0 0 0  PHQ-9 Score  0 0 0 0  Difficult doing work/chores  Not difficult at all Not difficult at all Not difficult at all       Fall Risk:    03/31/2022    8:41 AM 03/28/2022    8:30 AM 02/27/2022    9:28 AM 01/30/2022    9:21 AM 02/11/2021   11:14 AM  Fall Risk   Falls in the past year? 0 0 0 0 0  Number falls in past yr:  0 0 0 0  Injury with Fall?  0 0 0 0  Risk for fall due to :  No Fall Risks No Fall Risks No Fall Risks No Fall Risks  Follow up  Falls evaluation completed Falls evaluation completed Falls evaluation completed Falls evaluation completed      Functional Status Survey:      Assessment & Plan  Problem List Items Addressed This Visit       Cardiovascular and Mediastinum   Primary hypertension    Ongoing, likely stage 1 HTN based on office and at-home measures Reviewed kidney function from most recent labs and discussed importance of protecting kidneys from HTN  and diabetes Recommend trying Valsartan 40 mg PO QD at this time-reviewed signs and symptoms of allergic reaction and when to seek emergency care Continue exercise and diet efforts, recommend he also continue with stress reduction and meditation practices Follow up in 6 weeks to assess response to medication and BP effects       Relevant Medications   valsartan (DIOVAN) 40 MG tablet     Other   Decreased libido    Ongoing and concerning for pt Discussed getting BP medications established before adding ED medication  Patient is amenable to this plan  Will revisit this in 6 weeks at follow up       Other Visit Diagnoses     Need for influenza vaccination    -  Primary   Relevant Orders   Flu Vaccine QUAD 65moIM (Fluarix, Fluzone & Alfiuria Quad PF) (Completed)   Need for tetanus  booster       Relevant Orders   Tdap vaccine greater than or equal to 7yo IM (Completed)        Return in about 6 weeks (around 06/08/2022) for HTN, ed medication start?Cephus Slater Queena Monrreal, PA-C, have reviewed all documentation for this visit. The documentation on 04/27/22 for the exam, diagnosis, procedures, and orders are all accurate and complete.   Talitha Givens, MHS, PA-C [facility]

## 2022-04-27 NOTE — Assessment & Plan Note (Addendum)
Ongoing and concerning for pt Discussed getting BP medications established before adding ED medication  Patient is amenable to this plan  Will revisit this in 6 weeks at follow up

## 2022-04-27 NOTE — Assessment & Plan Note (Signed)
Ongoing, likely stage 1 HTN based on office and at-home measures Reviewed kidney function from most recent labs and discussed importance of protecting kidneys from HTN and diabetes Recommend trying Valsartan 40 mg PO QD at this time-reviewed signs and symptoms of allergic reaction and when to seek emergency care Continue exercise and diet efforts, recommend he also continue with stress reduction and meditation practices Follow up in 6 weeks to assess response to medication and BP effects

## 2022-04-28 ENCOUNTER — Encounter: Payer: Self-pay | Admitting: *Deleted

## 2022-04-28 ENCOUNTER — Encounter: Payer: 59 | Attending: Physician Assistant | Admitting: *Deleted

## 2022-04-28 VITALS — BP 124/80 | Wt 225.9 lb

## 2022-04-28 DIAGNOSIS — E119 Type 2 diabetes mellitus without complications: Secondary | ICD-10-CM | POA: Insufficient documentation

## 2022-04-28 NOTE — Patient Instructions (Signed)
Eat 3 meals day,   1-2  snacks a day Space meals 4-6 hours apart Include 1 serving of protein when eating fruit for a snack Avoid sugar sweetened drinks (tea, juices)  Continue your exercise program 6 x week for 45-90 minutes

## 2022-04-28 NOTE — Progress Notes (Signed)
Diabetes Self-Management Education  Visit Type: Follow-up  Appt. Start Time: 0835 Appt. End Time: 0930  04/28/2022  [name], identified by name and date of birth, is a 61 y.o. male with a diagnosis of Diabetes: Type 2.   ASSESSMENT  Blood pressure 124/80, weight 225 lb 14.4 oz (102.5 kg). Body mass index is 29.81 kg/m.   Diabetes Self-Management Education - 04/28/22 1153       Visit Information   Visit Type Follow-up      Initial Visit   Diabetes Type Type 2      Complications   How often do you check your blood sugar? Patient declines    Have you had a dilated eye exam in the past 12 months? Yes    Have you had a dental exam in the past 12 months? Yes    Are you checking your feet? Yes    How many days per week are you checking your feet? 7      Dietary Intake   Breakfast 3 meals and 1 snack/day - see 3 Day Food Record for recall    Beverage(s) water and sweetened tea      Activity / Exercise   Activity / Exercise Type Light (walking / raking leaves)    How many days per week do you exercise? 6    How many minutes per day do you exercise? 60    Total minutes per week of exercise 360      Patient Education   Disease Pathophysiology Definition of diabetes, type 1 and 2, and the diagnosis of diabetes;Factors that contribute to the development of diabetes    Healthy Eating Role of diet in the treatment of diabetes and the relationship between the three main macronutrients and blood glucose level;Food label reading, portion sizes and measuring food.;Carbohydrate counting    Being Active Role of exercise on diabetes management, blood pressure control and cardiac health.    Monitoring Identified appropriate SMBG and/or A1C goals.;Interpreting lab values - A1C, lipid, urine microalbumina.    Chronic complications Relationship between chronic complications and blood glucose control    Diabetes Stress and Support Identified and addressed patients feelings and  concerns about diabetes      Individualized Goals (developed by patient)   Nutrition Follow meal plan discussed;General guidelines for healthy choices and portions discussed    Physical Activity Exercise 5-7 days per week;60 minutes per day    Problem Solving Eating Pattern      Post-Education Assessment   Patient understands the diabetes disease and treatment process. Comprehends key points    Patient understands incorporating nutritional management into lifestyle. Comprehends key points    Patient undertands incorporating physical activity into lifestyle. Demonstrates understanding / competency    Patient understands using medications safely. N/A    Patient understands monitoring blood glucose, interpreting and using results N/A    Patient understands prevention, detection, and treatment of acute complications. N/A    Patient understands prevention, detection, and treatment of chronic complications. Demonstrates understanding / competency    Patient understands how to develop strategies to address psychosocial issues. Demonstrates understanding / competency    Patient understands how to develop strategies to promote health/change behavior. Demonstrates understanding / competency      Outcomes   Expected Outcomes Demonstrated interest in learning. Expect positive outcomes    Program Status Completed      Subsequent Visit   Since your last visit have you continued or begun to take your medications as  prescribed? Yes    Since your last visit have you had your blood pressure checked? Yes    Is your most recent blood pressure lower, unchanged, or higher since your last visit? Lower    Since your last visit have you experienced any weight changes? Gain    Weight Gain (lbs) 2.3    Since your last visit, are you checking your blood glucose at least once a day? N/A         Individualized Plan for Diabetes Self-Management Training:   Learning Objective:  Patient will have a greater  understanding of diabetes self-management. Patient education plan is to attend individual and/or group sessions per assessed needs and concerns.   Plan:   Patient Instructions  Eat 3 meals day,   1-2  snacks a day Space meals 4-6 hours apart Include 1 serving of protein when eating fruit for a snack Avoid sugar sweetened drinks (tea, juices)  Continue your exercise program 6 x week for 45-90 minutes  Expected Outcomes:  Demonstrated interest in learning. Expect positive outcomes  Education material provided:  Planning a Balanced Meal Making Choices Using Food Labels (ADA)  If problems or questions, patient to contact team via:   [name], RN, CCM, [place] [phone]  Future DSME appointment:  PRN

## 2022-05-24 ENCOUNTER — Telehealth: Payer: Self-pay | Admitting: Physician Assistant

## 2022-05-24 DIAGNOSIS — I10 Essential (primary) hypertension: Secondary | ICD-10-CM

## 2022-05-24 NOTE — Telephone Encounter (Signed)
med intiated 04/27/22 #30 1 RF- must have OV to ensure med. is working Requested Prescriptions  Refused Prescriptions Disp Refills  . valsartan (DIOVAN) 40 MG tablet [Pharmacy Med Name: VALSARTAN 40 MG TABLET] 90 tablet 1    Sig: TAKE 1 TABLET BY MOUTH EVERY DAY     Cardiovascular:  Angiotensin Receptor Blockers Failed - 05/24/2022 12:53 PM      Failed - Cr in normal range and within 180 days    Creatinine  Date Value Ref Range Status  12/01/2013 1.32 (H) 0.60 - 1.30 mg/dL Final   Creatinine, Ser  Date Value Ref Range Status  01/30/2022 1.41 (H) 0.76 - 1.27 mg/dL Final         Passed - K in normal range and within 180 days    Potassium  Date Value Ref Range Status  01/30/2022 4.6 3.5 - 5.2 mmol/L Final  12/01/2013 4.7 3.5 - 5.1 mmol/L Final         Passed - Patient is not pregnant      Passed - Last BP in normal range    BP Readings from Last 1 Encounters:  04/28/22 124/80         Passed - Valid encounter within last 6 months    Recent Outpatient Visits          3 weeks ago Need for influenza vaccination   [facility] Mecum, Erin E, PA-C   1 month ago Rash in adult   [facility], Erin E, PA-C   2 months ago Rash in adult   [facility], [place], PA-C   2 months ago Diabetes mellitus without complication ([place])   [place], PA-C   3 months ago Annual physical exam   [facility] Mecum, Dani Gobble, PA-C      Future Appointments            In 3 weeks Jon Billings, NP [employer], [place]

## 2022-05-24 NOTE — Telephone Encounter (Signed)
I don't understand what the question is.  He needs an office visit to follow up on his blood pressure.  He was prescribed valsartan.  I don't see where he is asking for something different.

## 2022-05-24 NOTE — Telephone Encounter (Signed)
Spoke with patient, states he is fine with valsartan- he was also confused as to why there was a RF req if he just started taking this 2 days ago. No further actions needed.

## 2022-06-13 NOTE — Progress Notes (Unsigned)
   There were no vitals taken for this visit.   Subjective:    Patient ID: Richard Rodgers, male    DOB: Sep 15, 1960, 61 y.o.   MRN: [id number]  HPI: Richard Rodgers is a 61 y.o. male  No chief complaint on file.  DIABETES Hypoglycemic episodes:{Blank single:19197::"yes","no"} Polydipsia/polyuria: {Blank single:19197::"yes","no"} Visual disturbance: {Blank single:19197::"yes","no"} Chest pain: {Blank single:19197::"yes","no"} Paresthesias: {Blank single:19197::"yes","no"} Glucose Monitoring: {Blank single:19197::"yes","no"}  Accucheck frequency: {Blank single:19197::"Not Checking","Daily","BID","TID"}  Fasting glucose:  Post prandial:  Evening:  Before meals: Taking Insulin?: {Blank single:19197::"yes","no"}  Long acting insulin:  Short acting insulin: Blood Pressure Monitoring: {Blank single:19197::"not checking","rarely","daily","weekly","monthly","a few times a day","a few times a week","a few times a month"} Retinal Examination: {Blank single:19197::"Up to Date","Not up to Date"} Foot Exam: {Blank single:19197::"Up to Date","Not up to Date"} Diabetic Education: {Blank single:19197::"Completed","Not Completed"} Pneumovax: {Blank single:19197::"Up to Date","Not up to Date","unknown"} Influenza: {Blank single:19197::"Up to Date","Not up to Date","unknown"} Aspirin: {Blank single:19197::"yes","no"}  HYPERTENSION / HYPERLIPIDEMIA Satisfied with current treatment? {Blank single:19197::"yes","no"} Duration of hypertension: {Blank single:19197::"chronic","months","years"} BP monitoring frequency: {Blank single:19197::"not checking","rarely","daily","weekly","monthly","a few times a day","a few times a week","a few times a month"} BP range:  BP medication side effects: {Blank single:19197::"yes","no"} Past BP meds: {Blank EYCXKGYJ:85631::"SHFW","YOVZCHYIFO","YDXAJOINOM/VEHMCNOBSJ","GGEZMOQH","UTMLYYTKPT","WSFKCLEXNT/ZGYF","VCBSWHQPRF  (bystolic)","carvedilol","chlorthalidone","clonidine","diltiazem","exforge HCT","HCTZ","irbesartan (avapro)","labetalol","lisinopril","lisinopril-HCTZ","losartan (cozaar)","methyldopa","nifedipine","olmesartan (benicar)","olmesartan-HCTZ","quinapril","ramipril","spironalactone","tekturna","valsartan","valsartan-HCTZ","verapamil"} Duration of hyperlipidemia: {Blank single:19197::"chronic","months","years"} Cholesterol medication side effects: {Blank single:19197::"yes","no"} Cholesterol supplements: {Blank multiple:19196::"none","fish oil","niacin","red yeast rice"} Past cholesterol medications: {Blank multiple:19196::"none","atorvastain (lipitor)","lovastatin (mevacor)","pravastatin (pravachol)","rosuvastatin (crestor)","simvastatin (zocor)","vytorin","fenofibrate (tricor)","gemfibrozil","ezetimide (zetia)","niaspan","lovaza"} Medication compliance: {Blank single:19197::"excellent compliance","good compliance","fair compliance","poor compliance"} Aspirin: {Blank single:19197::"yes","no"} Recent stressors: {Blank single:19197::"yes","no"} Recurrent headaches: {Blank single:19197::"yes","no"} Visual changes: {Blank single:19197::"yes","no"} Palpitations: {Blank single:19197::"yes","no"} Dyspnea: {Blank single:19197::"yes","no"} Chest pain: {Blank single:19197::"yes","no"} Lower extremity edema: {Blank single:19197::"yes","no"} Dizzy/lightheaded: {Blank single:19197::"yes","no"}  Relevant past medical, surgical, family and social history reviewed and updated as indicated. Interim medical history since our last visit reviewed. Allergies and medications reviewed and updated.  Review of Systems  Per HPI unless specifically indicated above     Objective:    There were no vitals taken for this visit.  Wt Readings from Last 3 Encounters:  04/28/22 225 lb 14.4 oz (102.5 kg)  04/27/22 224 lb 12.8 oz (102 kg)  03/31/22 223 lb 9.6 oz (101.4 kg)    Physical Exam  Results for orders placed or  performed in visit on 03/28/22  HM DIABETES EYE EXAM  Result Value Ref Range   HM Diabetic Eye Exam No Retinopathy No Retinopathy      Assessment & Plan:   Problem List Items Addressed This Visit       Cardiovascular and Mediastinum   Primary hypertension - Primary     Endocrine   Type 2 diabetes with complication ([place])     Other   Hyperlipidemia     Follow up plan: No follow-ups on file.

## 2022-06-14 ENCOUNTER — Ambulatory Visit: Payer: 59 | Admitting: Nurse Practitioner

## 2022-06-14 ENCOUNTER — Encounter: Payer: Self-pay | Admitting: Nurse Practitioner

## 2022-06-14 VITALS — BP 125/77 | HR 86 | Temp 98.6°F | Wt 229.5 lb

## 2022-06-14 DIAGNOSIS — E7841 Elevated Lipoprotein(a): Secondary | ICD-10-CM | POA: Diagnosis not present

## 2022-06-14 DIAGNOSIS — Z6830 Body mass index (BMI) 30.0-30.9, adult: Secondary | ICD-10-CM

## 2022-06-14 DIAGNOSIS — I10 Essential (primary) hypertension: Secondary | ICD-10-CM

## 2022-06-14 DIAGNOSIS — E118 Type 2 diabetes mellitus with unspecified complications: Secondary | ICD-10-CM

## 2022-06-14 MED ORDER — VALSARTAN 40 MG PO TABS
40.0000 mg | ORAL_TABLET | Freq: Every day | ORAL | 1 refills | Status: DC
  Filled 2022-06-28 (×2): qty 90, 90d supply, fill #0
  Filled 2022-10-02: qty 90, 90d supply, fill #1

## 2022-06-14 MED ORDER — SILDENAFIL CITRATE 100 MG PO TABS: 50.0000 mg | ORAL_TABLET | Freq: Every day | ORAL | 3 refills | Status: DC | PRN

## 2022-06-14 NOTE — Assessment & Plan Note (Signed)
BMI 30.29. Recommended eating smaller high protein, low fat meals more frequently and exercising 30 mins a day 5 times a week with a goal of 10-15lb weight loss in the next 3 months. Patient voiced their understanding and motivation to adhere to these recommendations.

## 2022-06-14 NOTE — Assessment & Plan Note (Signed)
Chronic.  Controlled without medication.  ASCVD risk score of 27.2%.  Reviewed with patient during visit today.  Patient declines starting statin. Labs ordered today.  Return to clinic in 6 months for reevaluation.  Call sooner if concerns arise.

## 2022-06-14 NOTE — Assessment & Plan Note (Signed)
Chronic.  Controlled without medication.  Microalbumin obtained today.  Has Eye exam scheduled for this week. Labs ordered today.  Return to clinic in 6 months for reevaluation.  Call sooner if concerns arise.

## 2022-06-14 NOTE — Assessment & Plan Note (Signed)
Chronic.  Controlled.  Continue with current medication regimen of Valsartan '40mg'$  daily.  Labs ordered today.  Return to clinic in 6 months for reevaluation.  Call sooner if concerns arise.

## 2022-06-15 LAB — COMPREHENSIVE METABOLIC PANEL
ALT: 33 IU/L (ref 0–44)
AST: 28 IU/L (ref 0–40)
Albumin/Globulin Ratio: 1.5 (ref 1.2–2.2)
Albumin: 4.4 g/dL (ref 3.9–4.9)
Alkaline Phosphatase: 94 IU/L (ref 44–121)
BUN/Creatinine Ratio: 9 — ABNORMAL LOW (ref 10–24)
BUN: 13 mg/dL (ref 8–27)
Bilirubin Total: 0.3 mg/dL (ref 0.0–1.2)
CO2: 24 mmol/L (ref 20–29)
Calcium: 9.4 mg/dL (ref 8.6–10.2)
Chloride: 102 mmol/L (ref 96–106)
Creatinine, Ser: 1.46 mg/dL — ABNORMAL HIGH (ref 0.76–1.27)
Globulin, Total: 2.9 g/dL (ref 1.5–4.5)
Glucose: 120 mg/dL — ABNORMAL HIGH (ref 70–99)
Potassium: 4.5 mmol/L (ref 3.5–5.2)
Sodium: 140 mmol/L (ref 134–144)
Total Protein: 7.3 g/dL (ref 6.0–8.5)
eGFR: 54 mL/min/{1.73_m2} — ABNORMAL LOW (ref >59)

## 2022-06-15 LAB — LIPID PANEL
Chol/HDL Ratio: 6.9 ratio — ABNORMAL HIGH (ref 0.0–5.0)
Cholesterol, Total: 221 mg/dL — ABNORMAL HIGH (ref 100–199)
HDL: 32 mg/dL — ABNORMAL LOW (ref >39)
LDL Chol Calc (NIH): 136 mg/dL — ABNORMAL HIGH (ref 0–99)
Triglycerides: 291 mg/dL — ABNORMAL HIGH (ref 0–149)
VLDL Cholesterol Cal: 53 mg/dL — ABNORMAL HIGH (ref 5–40)

## 2022-06-15 LAB — HEMOGLOBIN A1C
Est. average glucose Bld gHb Est-mCnc: 143 mg/dL
Hgb A1c MFr Bld: 6.6 % — ABNORMAL HIGH (ref 4.8–5.6)

## 2022-06-15 MED ORDER — ROSUVASTATIN CALCIUM 5 MG PO TABS: 5.0000 mg | ORAL_TABLET | Freq: Every day | ORAL | 1 refills | Status: DC

## 2022-06-15 NOTE — Progress Notes (Signed)
I reviewed his supplements and do not believe any of them are hurting his kidneys.  Chronic kidney disease is one of the most common side effects and Diabetes.  That is likely the cause.  It is something we monitor and make sure he doesn't take any medications that would harm them.    I have sent in the crestor.

## 2022-06-15 NOTE — Progress Notes (Signed)
Hi Mr. Glogowski. It was nice to see you yesterday.  Your lab work looks good.  A1c remains well controlled at 6.6.  Keep up the good work.  Your cholesterol did increase from prior.  Your Cardiac risk score also went up some.  I do recommend you start Crestor '5mg'$  as we discussed yesterday.  If you agree, I can send this to the pharmacy.  Also your kidney function shows some decline.  No medication changes at this time.  However, I do recommend avoiding NSAIDS such as motrin, aleve and ibuprofen.   Continue with your current medication regimen.  Follow up as discussed.  Please let me know if you have any questions.   The 10-year ASCVD risk score (Arnett DK, et al., 2019) is: 28.5%   Values used to calculate the score:     Age: 5 years     Sex: Male     Is Non-Hispanic African American: Yes     Diabetic: Yes     Tobacco smoker: No     Systolic Blood Pressure: 333 mmHg     Is BP treated: Yes     HDL Cholesterol: 32 mg/dL     Total Cholesterol: 221 mg/dL

## 2022-06-15 NOTE — Addendum Note (Signed)
Addended by: Jon Billings on: 06/15/2022 04:08 PM   Modules accepted: Orders

## 2022-06-20 DIAGNOSIS — H524 Presbyopia: Secondary | ICD-10-CM | POA: Diagnosis not present

## 2022-06-27 ENCOUNTER — Other Ambulatory Visit: Payer: Self-pay | Admitting: Nurse Practitioner

## 2022-06-27 ENCOUNTER — Other Ambulatory Visit: Payer: Self-pay

## 2022-06-27 ENCOUNTER — Other Ambulatory Visit (HOSPITAL_COMMUNITY): Payer: Self-pay

## 2022-06-27 DIAGNOSIS — I10 Essential (primary) hypertension: Secondary | ICD-10-CM

## 2022-06-27 NOTE — Telephone Encounter (Signed)
Unable to refill per protocol, Rx request is too soon. Last refill 06/14/22 for 90. Will refuse duplicate request.

## 2022-06-27 NOTE — Telephone Encounter (Signed)
Patient is requesting that rx for valsartan (DIOVAN) 40 MG tablet be sent to [facility] Phone: [phone]  Fax: [phone]     So insurance will cover rx

## 2022-06-28 ENCOUNTER — Other Ambulatory Visit: Payer: Self-pay

## 2022-06-30 ENCOUNTER — Other Ambulatory Visit: Payer: Self-pay

## 2022-10-02 ENCOUNTER — Encounter: Payer: Self-pay | Admitting: Nurse Practitioner

## 2022-10-02 ENCOUNTER — Other Ambulatory Visit: Payer: Self-pay

## 2022-12-16 ENCOUNTER — Other Ambulatory Visit: Payer: Self-pay | Admitting: Nurse Practitioner

## 2022-12-18 NOTE — Telephone Encounter (Signed)
Requested Prescriptions  Pending Prescriptions Disp Refills   sildenafil (VIAGRA) 100 MG tablet [Pharmacy Med Name: SILDENAFIL 100 MG TABLET] 5 tablet 7    Sig: TAKE 0.5-1 TABLETS BY MOUTH DAILY AS NEEDED FOR ERECTILE DYSFUNCTION.     Urology: Erectile Dysfunction Agents Passed - 12/16/2022  6:53 AM      Passed - AST in normal range and within 360 days    AST  Date Value Ref Range Status  06/14/2022 28 0 - 40 IU/L Final   AST (SGOT) Piccolo, Waived  Date Value Ref Range Status  02/06/2018 31 11 - 38 U/L Final         Passed - ALT in normal range and within 360 days    ALT  Date Value Ref Range Status  06/14/2022 33 0 - 44 IU/L Final   ALT (SGPT) Piccolo, Waived  Date Value Ref Range Status  02/06/2018 36 10 - 47 U/L Final         Passed - Last BP in normal range    BP Readings from Last 1 Encounters:  06/14/22 125/77         Passed - Valid encounter within last 12 months    Recent Outpatient Visits           6 months ago Type 2 diabetes with complication [facility])   [place] [facility] Larae Grooms, NP   7 months ago Need for influenza vaccination   [place] [facility] Mecum, Oswaldo Conroy, PA-C   8 months ago Rash in adult   [place] [facility] Mecum, Oswaldo Conroy, PA-C   9 months ago Rash in adult   [place] [facility] Mecum, Erin E, PA-C   9 months ago Diabetes mellitus without complication (HCC)   [place] [facility] Mecum, Oswaldo Conroy, PA-C       Future Appointments             In 1 month Larae Grooms, NP [place] [facility]

## 2022-12-20 ENCOUNTER — Other Ambulatory Visit: Payer: Self-pay | Admitting: Nurse Practitioner

## 2022-12-20 NOTE — Telephone Encounter (Signed)
Requested Prescriptions  Pending Prescriptions Disp Refills   rosuvastatin (CRESTOR) 5 MG tablet [Pharmacy Med Name: ROSUVASTATIN CALCIUM 5 MG TAB] 90 tablet 0    Sig: TAKE 1 TABLET (5 MG TOTAL) BY MOUTH DAILY.     Cardiovascular:  Antilipid - Statins 2 Failed - 12/20/2022  2:24 AM      Failed - Cr in normal range and within 360 days    Creatinine  Date Value Ref Range Status  12/01/2013 1.32 (H) 0.60 - 1.30 mg/dL Final   Creatinine, Ser  Date Value Ref Range Status  06/14/2022 1.46 (H) 0.76 - 1.27 mg/dL Final         Failed - Lipid Panel in normal range within the last 12 months    Cholesterol, Total  Date Value Ref Range Status  06/14/2022 221 (H) 100 - 199 mg/dL Final   Cholesterol Piccolo, Waived  Date Value Ref Range Status  02/06/2018 189 <200 mg/dL Final    Comment:                            Desirable                <200                         Borderline High      200- 239                         High                     >239    LDL Chol Calc (NIH)  Date Value Ref Range Status  06/14/2022 136 (H) 0 - 99 mg/dL Final   HDL  Date Value Ref Range Status  06/14/2022 32 (L) >39 mg/dL Final   Triglycerides  Date Value Ref Range Status  06/14/2022 291 (H) 0 - 149 mg/dL Final   Triglycerides Piccolo,Waived  Date Value Ref Range Status  02/06/2018 274 (H) <150 mg/dL Final    Comment:                            Normal                   <150                         Borderline High     150 - 199                         High                200 - 499                         Very High                >499          Passed - Patient is not pregnant      Passed - Valid encounter within last 12 months    Recent Outpatient Visits           6 months ago Type 2 diabetes with complication [facility])   [place] [facility] Larae Grooms, NP   7 months  ago Need for influenza vaccination   [place] [facility] Mecum, Oswaldo Conroy, PA-C   8 months  ago Rash in adult   [place] [facility] Mecum, Oswaldo Conroy, PA-C   9 months ago Rash in adult   [place] [facility] Mecum, Erin E, PA-C   9 months ago Diabetes mellitus without complication (HCC)   [place] [facility] Mecum, Oswaldo Conroy, PA-C       Future Appointments             In 1 month Larae Grooms, NP [facility], [facility]

## 2023-01-11 ENCOUNTER — Other Ambulatory Visit: Payer: Self-pay | Admitting: Nurse Practitioner

## 2023-01-11 ENCOUNTER — Other Ambulatory Visit: Payer: Self-pay

## 2023-01-11 DIAGNOSIS — I10 Essential (primary) hypertension: Secondary | ICD-10-CM

## 2023-01-12 ENCOUNTER — Other Ambulatory Visit: Payer: Self-pay

## 2023-01-12 MED FILL — Valsartan Tab 40 MG: ORAL | 90 days supply | Qty: 90 | Fill #0 | Status: AC

## 2023-01-12 NOTE — Telephone Encounter (Signed)
Requested Prescriptions  Pending Prescriptions Disp Refills   valsartan (DIOVAN) 40 MG tablet 90 tablet 0    Sig: Take 1 tablet (40 mg total) by mouth daily.     Cardiovascular:  Angiotensin Receptor Blockers Failed - 01/11/2023  2:48 PM      Failed - Cr in normal range and within 180 days    Creatinine  Date Value Ref Range Status  12/01/2013 1.32 (H) 0.60 - 1.30 mg/dL Final   Creatinine, Ser  Date Value Ref Range Status  06/14/2022 1.46 (H) 0.76 - 1.27 mg/dL Final         Failed - K in normal range and within 180 days    Potassium  Date Value Ref Range Status  06/14/2022 4.5 3.5 - 5.2 mmol/L Final  12/01/2013 4.7 3.5 - 5.1 mmol/L Final         Failed - Valid encounter within last 6 months    Recent Outpatient Visits           7 months ago Type 2 diabetes with complication [facility])   [place] [facility] Larae Grooms, NP   8 months ago Need for influenza vaccination   [place] [facility] Mecum, Oswaldo Conroy, PA-C   9 months ago Rash in adult   [employer] [facility] Mecum, Erin E, PA-C   10 months ago Rash in adult   [place] [facility] Mecum, Erin E, PA-C   10 months ago Diabetes mellitus without complication (HCC)   [place] [facility] Mecum, Oswaldo Conroy, PA-C       Future Appointments             In 2 weeks Larae Grooms, NP [place] [facility]            Passed - Patient is not pregnant      Passed - Last BP in normal range    BP Readings from Last 1 Encounters:  06/14/22 125/77

## 2023-02-01 ENCOUNTER — Encounter: Payer: Self-pay | Admitting: Nurse Practitioner

## 2023-02-01 ENCOUNTER — Ambulatory Visit (INDEPENDENT_AMBULATORY_CARE_PROVIDER_SITE_OTHER): Payer: 59 | Admitting: Nurse Practitioner

## 2023-02-01 VITALS — BP 129/74 | HR 76 | Temp 98.7°F | Ht 72.8 in | Wt 226.4 lb

## 2023-02-01 DIAGNOSIS — E7841 Elevated Lipoprotein(a): Secondary | ICD-10-CM

## 2023-02-01 DIAGNOSIS — Z0000 Encounter for general adult medical examination without abnormal findings: Secondary | ICD-10-CM | POA: Diagnosis not present

## 2023-02-01 DIAGNOSIS — E118 Type 2 diabetes mellitus with unspecified complications: Secondary | ICD-10-CM

## 2023-02-01 DIAGNOSIS — I10 Essential (primary) hypertension: Secondary | ICD-10-CM | POA: Diagnosis not present

## 2023-02-01 LAB — MICROALBUMIN, URINE WAIVED
Creatinine, Urine Waived: 100 mg/dL (ref 10–300)
Microalb, Ur Waived: 30 mg/L — ABNORMAL HIGH (ref 0–19)
Microalb/Creat Ratio: <30 mg/g (ref <30)

## 2023-02-01 LAB — URINALYSIS, ROUTINE W REFLEX MICROSCOPIC
Bilirubin, UA: NEGATIVE
Glucose, UA: NEGATIVE
Ketones, UA: NEGATIVE
Leukocytes,UA: NEGATIVE
Nitrite, UA: NEGATIVE
Protein,UA: NEGATIVE
RBC, UA: NEGATIVE
Specific Gravity, UA: 1.015 (ref 1.005–1.030)
Urobilinogen, Ur: 0.2 mg/dL (ref 0.2–1.0)
pH, UA: 5.5 (ref 5.0–7.5)

## 2023-02-01 MED ORDER — SILDENAFIL CITRATE 100 MG PO TABS
ORAL_TABLET | ORAL | 7 refills | Status: DC
  Filled 2023-03-06: qty 5, 30d supply, fill #0
  Filled 2023-04-01: qty 5, 30d supply, fill #1
  Filled 2023-05-13: qty 5, 30d supply, fill #2

## 2023-02-01 MED ORDER — ROSUVASTATIN CALCIUM 10 MG PO TABS
10.0000 mg | ORAL_TABLET | Freq: Every day | ORAL | 1 refills | Status: DC
  Filled 2023-05-08: qty 90, 90d supply, fill #0

## 2023-02-01 MED ORDER — VALSARTAN 40 MG PO TABS
40.0000 mg | ORAL_TABLET | Freq: Every day | ORAL | 1 refills | Status: DC
Start: 2023-02-01 — End: 2023-08-15
  Filled 2023-03-06 – 2023-03-26 (×3): qty 90, 90d supply, fill #0
  Filled 2023-07-11: qty 90, 90d supply, fill #1

## 2023-02-01 NOTE — Assessment & Plan Note (Signed)
Chronic.  Controlled.  Continue with current medication regimen of Valsartan 40mg  daily.  Refills sent today.  Continue to check blood pressures at home.  Labs ordered today.  Return to clinic in 6 months for reevaluation.  Call sooner if concerns arise.

## 2023-02-01 NOTE — Assessment & Plan Note (Signed)
Chronic.  Controlled without medication.  Microalbumin obtained today.  Last A1c was 6.6%.  Eye exam requested from [facility].  Labs ordered today.  Return to clinic in 6 months for reevaluation.  Call sooner if concerns arise.

## 2023-02-01 NOTE — Progress Notes (Signed)
BP 129/74   Pulse 76   Temp 98.7 F (37.1 C) (Oral)   Ht 6' 0.8" (1.849 m)   Wt 226 lb 6.4 oz (102.7 kg)   SpO2 96%   BMI 30.03 kg/m    Subjective:    Patient ID: Richard Rodgers, male    DOB: [birth year], 62 y.o.   MRN: [id number]  HPI: Richard Rodgers is a 62 y.o. male presenting on 02/01/2023 for comprehensive medical examination. Current medical complaints include:none  He currently lives with: Interim Problems from his last visit: no  HYPERTENSION / HYPERLIPIDEMIA Satisfied with current treatment? yes Duration of hypertension: years BP monitoring frequency: daily BP range: 125-135/65-75 BP medication side effects: no Past BP meds: valsartan Duration of hyperlipidemia: years Cholesterol medication side effects: no Cholesterol supplements: none Past cholesterol medications: rosuvastatin (crestor) Medication compliance: excellent compliance Aspirin: no Recent stressors: no Recurrent headaches: no Visual changes: no Palpitations: no Dyspnea: no Chest pain: no Lower extremity edema: no Dizzy/lightheaded: no  DIABETES Diet controlled.  Eye exam requested. Hypoglycemic episodes:no Polydipsia/polyuria: no Visual disturbance: no Chest pain: no Paresthesias: no Glucose Monitoring: no  Accucheck frequency: Not Checking  Fasting glucose:  Post prandial:  Evening:  Before meals: Taking Insulin?: no  Long acting insulin:  Short acting insulin: Blood Pressure Monitoring: daily Retinal Examination: Up to Date Foot Exam: Up to Date Diabetic Education: Not Completed Pneumovax: Up to Date Influenza: Up to Date Aspirin: no   Depression Screen done today and results listed below:     02/01/2023    8:39 AM 06/14/2022    8:45 AM 03/31/2022    8:41 AM 03/28/2022    8:31 AM 02/27/2022    9:28 AM  Depression screen PHQ 2/9  Decreased Interest 0 0 0 0 0  Down, Depressed, Hopeless 0 0 0 0 0  PHQ - 2 Score 0 0 0 0 0  Altered sleeping 0 0  0 0  Tired, decreased  energy 0 0  0 0  Change in appetite 0 0  0 0  Feeling bad or failure about yourself  0 0  0 0  Trouble concentrating 0 0  0 0  Moving slowly or fidgety/restless 0 0  0 0  Suicidal thoughts 0 0  0 0  PHQ-9 Score 0 0  0 0  Difficult doing work/chores Not difficult at all Not difficult at all  Not difficult at all Not difficult at all    The patient does not have a history of falls. I did complete a risk assessment for falls. A plan of care for falls was documented.   Past Medical History:  Past Medical History:  Diagnosis Date   DM (diabetes mellitus) (HCC)    HTN (hypertension)    Hyperlipidemia    Vertigo     Surgical History:  Past Surgical History:  Procedure Laterality Date   COLONOSCOPY     COLONOSCOPY WITH PROPOFOL N/A 01/07/2018   Procedure: COLONOSCOPY WITH PROPOFOL;  Surgeon: Pasty Spillers, MD;  Location: [facility] ENDOSCOPY;  Service: Endoscopy;  Laterality: N/A;   CYST REMOVAL LEG Left    Knee   VASECTOMY      Medications:  Current Outpatient Medications on File Prior to Visit  Medication Sig   cyanocobalamin 1000 MCG tablet Take 1,000 mcg by mouth daily.   folic acid (FOLVITE) 800 MCG tablet Take 800 mcg by mouth daily.   Magnesium Citrate 100 MG CAPS Take 1 capsule by mouth daily.   Misc  Natural Products (YUMVS BEET ROOT-TART CHERRY PO) Take 2,000 mg by mouth daily.   Omega-3 Fatty Acids (FISH OIL) 1000 MG CAPS Take 1,000 mg by mouth daily.   zinc gluconate 50 MG tablet Take 50 mg by mouth daily.   No current facility-administered medications on file prior to visit.    Allergies:  Allergies  Allergen Reactions   Amlodipine Rash    Social History:  Social History   Socioeconomic History   Marital status: Married    Spouse name: Not on file   Number of children: Not on file   Years of education: Not on file   Highest education level: Not on file  Occupational History   Not on file  Tobacco Use   Smoking status: Never   Smokeless tobacco:  Never  Vaping Use   Vaping Use: Never used  Substance and Sexual Activity   Alcohol use: Never   Drug use: Never   Sexual activity: Yes  Other Topics Concern   Not on file  Social History Narrative   Not on file   Social Determinants of Health   Financial Resource Strain: Not on file  Food Insecurity: Not on file  Transportation Needs: Not on file  Physical Activity: Not on file  Stress: Not on file  Social Connections: Not on file  Intimate Partner Violence: Not on file   Social History   Tobacco Use  Smoking Status Never  Smokeless Tobacco Never   Social History   Substance and Sexual Activity  Alcohol Use Never    Family History:  Family History  Problem Relation Age of Onset   Diabetes Mother    Hypertension Mother    Stroke Father    Lupus Sister    Diabetes Brother    Diabetes Maternal Grandmother     Past medical history, surgical history, medications, allergies, family history and social history reviewed with patient today and changes made to appropriate areas of the chart.   Review of Systems  HENT:         Denies vision changes.  Eyes:  Negative for blurred vision and double vision.  Respiratory:  Negative for shortness of breath.   Cardiovascular:  Negative for chest pain, palpitations and leg swelling.  Neurological:  Negative for dizziness, tingling and headaches.  Endo/Heme/Allergies:  Negative for polydipsia.       Denies Polyuria   All other ROS negative except what is listed above and in the HPI.      Objective:    BP 129/74   Pulse 76   Temp 98.7 F (37.1 C) (Oral)   Ht 6' 0.8" (1.849 m)   Wt 226 lb 6.4 oz (102.7 kg)   SpO2 96%   BMI 30.03 kg/m   Wt Readings from Last 3 Encounters:  02/01/23 226 lb 6.4 oz (102.7 kg)  06/14/22 229 lb 8 oz (104.1 kg)  04/28/22 225 lb 14.4 oz (102.5 kg)    Physical Exam Vitals and nursing note reviewed.  Constitutional:      General: He is not in acute distress.    Appearance: Normal  appearance. He is not ill-appearing, toxic-appearing or diaphoretic.  HENT:     Head: Normocephalic.     Right Ear: Tympanic membrane, ear canal and external ear normal.     Left Ear: Tympanic membrane, ear canal and external ear normal.     Nose: Nose normal. No congestion or rhinorrhea.     Mouth/Throat:     Mouth: Mucous membranes  are moist.  Eyes:     General:        Right eye: No discharge.        Left eye: No discharge.     Extraocular Movements: Extraocular movements intact.     Conjunctiva/sclera: Conjunctivae normal.     Pupils: Pupils are equal, round, and reactive to light.  Cardiovascular:     Rate and Rhythm: Normal rate and regular rhythm.     Heart sounds: No murmur heard. Pulmonary:     Effort: Pulmonary effort is normal. No respiratory distress.     Breath sounds: Normal breath sounds. No wheezing, rhonchi or rales.  Abdominal:     General: Abdomen is flat. Bowel sounds are normal. There is no distension.     Palpations: Abdomen is soft.     Tenderness: There is no abdominal tenderness. There is no guarding.  Musculoskeletal:     Cervical back: Normal range of motion and neck supple.  Skin:    General: Skin is warm and dry.     Capillary Refill: Capillary refill takes less than 2 seconds.  Neurological:     General: No focal deficit present.     Mental Status: He is alert and oriented to person, place, and time.     Cranial Nerves: No cranial nerve deficit.     Motor: No weakness.     Deep Tendon Reflexes: Reflexes normal.  Psychiatric:        Mood and Affect: Mood normal.        Behavior: Behavior normal.        Thought Content: Thought content normal.        Judgment: Judgment normal.     Results for orders placed or performed in visit on 06/14/22  HgB A1c  Result Value Ref Range   Hgb A1c MFr Bld 6.6 (H) 4.8 - 5.6 %   Est. average glucose Bld gHb Est-mCnc 143 mg/dL  Comp Met (CMET)  Result Value Ref Range   Glucose 120 (H) 70 - 99 mg/dL   BUN 13  8 - 27 mg/dL   Creatinine, Ser 7.84 (H) 0.76 - 1.27 mg/dL   eGFR 54 (L) >69 GE/XBM/8.41   BUN/Creatinine Ratio 9 (L) 10 - 24   Sodium 140 134 - 144 mmol/L   Potassium 4.5 3.5 - 5.2 mmol/L   Chloride 102 96 - 106 mmol/L   CO2 24 20 - 29 mmol/L   Calcium 9.4 8.6 - 10.2 mg/dL   Total Protein 7.3 6.0 - 8.5 g/dL   Albumin 4.4 3.9 - 4.9 g/dL   Globulin, Total 2.9 1.5 - 4.5 g/dL   Albumin/Globulin Ratio 1.5 1.2 - 2.2   Bilirubin Total 0.3 0.0 - 1.2 mg/dL   Alkaline Phosphatase 94 44 - 121 IU/L   AST 28 0 - 40 IU/L   ALT 33 0 - 44 IU/L  Lipid Profile  Result Value Ref Range   Cholesterol, Total 221 (H) 100 - 199 mg/dL   Triglycerides 324 (H) 0 - 149 mg/dL   HDL 32 (L) >40 mg/dL   VLDL Cholesterol Cal 53 (H) 5 - 40 mg/dL   LDL Chol Calc (NIH) 102 (H) 0 - 99 mg/dL   Chol/HDL Ratio 6.9 (H) 0.0 - 5.0 ratio      Assessment & Plan:   Problem List Items Addressed This Visit       Cardiovascular and Mediastinum   Primary hypertension    Chronic.  Controlled.  Continue with current medication  regimen of Valsartan 40mg  daily.  Refills sent today.  Continue to check blood pressures at home.  Labs ordered today.  Return to clinic in 6 months for reevaluation.  Call sooner if concerns arise.        Relevant Medications   rosuvastatin (CRESTOR) 10 MG tablet   valsartan (DIOVAN) 40 MG tablet   sildenafil (VIAGRA) 100 MG tablet     Endocrine   Type 2 diabetes with complication (HCC)    Chronic.  Controlled without medication.  Microalbumin obtained today.  Last A1c was 6.6%.  Eye exam requested from [facility].  Labs ordered today.  Return to clinic in 6 months for reevaluation.  Call sooner if concerns arise.        Relevant Medications   rosuvastatin (CRESTOR) 10 MG tablet   valsartan (DIOVAN) 40 MG tablet   Other Relevant Orders   HgB A1c   Microalbumin, Urine Waived     Other   Hyperlipidemia    Chronic.  Rosuvastatin increased to 10mg  due to ASCVD risk score of 27.2%.   Reviewed with patient during visit today.  Medication sent in to the pharmacy.  Labs ordered today.  Return to clinic in 6 months for reevaluation.  Call sooner if concerns arise.        Relevant Medications   rosuvastatin (CRESTOR) 10 MG tablet   valsartan (DIOVAN) 40 MG tablet   sildenafil (VIAGRA) 100 MG tablet   Other Relevant Orders   Lipid panel   Other Visit Diagnoses     Annual physical exam    -  Primary   Health maintenance reviewed during visit today.  Labs ordered.  Vaccines up to date.  Colonoscopy up to date.   Relevant Orders   TSH   PSA   Lipid panel   CBC with Differential/Platelet   Comprehensive metabolic panel   Urinalysis, Routine w reflex microscopic   HgB A1c   Microalbumin, Urine Waived        Discussed aspirin prophylaxis for myocardial infarction prevention and decision was it was not indicated  LABORATORY TESTING:  Health maintenance labs ordered today as discussed above.   The natural history of prostate cancer and ongoing controversy regarding screening and potential treatment outcomes of prostate cancer has been discussed with the patient. The meaning of a false positive PSA and a false negative PSA has been discussed. He indicates understanding of the limitations of this screening test and wishes to proceed with screening PSA testing.   IMMUNIZATIONS:   - Tdap: Tetanus vaccination status reviewed: last tetanus booster within 10 years. - Influenza: Postponed to flu season - Pneumovax: Not applicable - Prevnar: Not applicable - COVID: Not applicable - HPV: Not applicable - Shingrix vaccine: Up to date  SCREENING: - Colonoscopy: Up to date  Discussed with patient purpose of the colonoscopy is to detect colon cancer at curable precancerous or early stages   - AAA Screening: Not applicable  -Hearing Test: Not applicable  -Spirometry: Not applicable   PATIENT COUNSELING:    Sexuality: Discussed sexually transmitted diseases, partner  selection, use of condoms, avoidance of unintended pregnancy  and contraceptive alternatives.   Advised to avoid cigarette smoking.  I discussed with the patient that most people either abstain from alcohol or drink within safe limits (<=14/week and <=4 drinks/occasion for males, <=7/weeks and <= 3 drinks/occasion for females) and that the risk for alcohol disorders and other health effects rises proportionally with the number of drinks per week and how  often a drinker exceeds daily limits.  Discussed cessation/primary prevention of drug use and availability of treatment for abuse.   Diet: Encouraged to adjust caloric intake to maintain  or achieve ideal body weight, to reduce intake of dietary saturated fat and total fat, to limit sodium intake by avoiding high sodium foods and not adding table salt, and to maintain adequate dietary potassium and calcium preferably from fresh fruits, vegetables, and low-fat dairy products.    stressed the importance of regular exercise  Injury prevention: Discussed safety belts, safety helmets, smoke detector, smoking near bedding or upholstery.   Dental health: Discussed importance of regular tooth brushing, flossing, and dental visits.   Follow up plan: NEXT PREVENTATIVE PHYSICAL DUE IN 1 YEAR. Return in about 6 months (around 08/03/2023) for HTN, HLD, DM2 FU.

## 2023-02-01 NOTE — Assessment & Plan Note (Signed)
Chronic.  Rosuvastatin increased to 10mg  due to ASCVD risk score of 27.2%.  Reviewed with patient during visit today.  Medication sent in to the pharmacy.  Labs ordered today.  Return to clinic in 6 months for reevaluation.  Call sooner if concerns arise.

## 2023-02-02 LAB — COMPREHENSIVE METABOLIC PANEL
ALT: 32 IU/L (ref 0–44)
AST: 28 IU/L (ref 0–40)
Albumin: 4.4 g/dL (ref 3.9–4.9)
Alkaline Phosphatase: 114 IU/L (ref 44–121)
BUN/Creatinine Ratio: 13 (ref 10–24)
BUN: 19 mg/dL (ref 8–27)
Bilirubin Total: 0.2 mg/dL (ref 0.0–1.2)
CO2: 24 mmol/L (ref 20–29)
Calcium: 9.2 mg/dL (ref 8.6–10.2)
Chloride: 101 mmol/L (ref 96–106)
Creatinine, Ser: 1.49 mg/dL — ABNORMAL HIGH (ref 0.76–1.27)
Globulin, Total: 2.9 g/dL (ref 1.5–4.5)
Glucose: 111 mg/dL — ABNORMAL HIGH (ref 70–99)
Potassium: 4.7 mmol/L (ref 3.5–5.2)
Sodium: 138 mmol/L (ref 134–144)
Total Protein: 7.3 g/dL (ref 6.0–8.5)
eGFR: 53 mL/min/{1.73_m2} — ABNORMAL LOW (ref >59)

## 2023-02-02 LAB — CBC WITH DIFFERENTIAL/PLATELET
Basophils Absolute: 0 10*3/uL (ref 0.0–0.2)
Basos: 0 %
EOS (ABSOLUTE): 0.2 10*3/uL (ref 0.0–0.4)
Eos: 3 %
Hematocrit: 40 % (ref 37.5–51.0)
Hemoglobin: 12.8 g/dL — ABNORMAL LOW (ref 13.0–17.7)
Immature Grans (Abs): 0 10*3/uL (ref 0.0–0.1)
Immature Granulocytes: 0 %
Lymphocytes Absolute: 1.7 10*3/uL (ref 0.7–3.1)
Lymphs: 31 %
MCH: 30.5 pg (ref 26.6–33.0)
MCHC: 32 g/dL (ref 31.5–35.7)
MCV: 95 fL (ref 79–97)
Monocytes Absolute: 0.5 10*3/uL (ref 0.1–0.9)
Monocytes: 9 %
Neutrophils Absolute: 3.2 10*3/uL (ref 1.4–7.0)
Neutrophils: 57 %
Platelets: 199 10*3/uL (ref 150–450)
RBC: 4.2 x10E6/uL (ref 4.14–5.80)
RDW: 14.6 % (ref 11.6–15.4)
WBC: 5.6 10*3/uL (ref 3.4–10.8)

## 2023-02-02 LAB — LIPID PANEL
Chol/HDL Ratio: 4.9 ratio (ref 0.0–5.0)
Cholesterol, Total: 146 mg/dL (ref 100–199)
HDL: 30 mg/dL — ABNORMAL LOW (ref >39)
LDL Chol Calc (NIH): 94 mg/dL (ref 0–99)
Triglycerides: 118 mg/dL (ref 0–149)
VLDL Cholesterol Cal: 22 mg/dL (ref 5–40)

## 2023-02-02 LAB — HEMOGLOBIN A1C
Est. average glucose Bld gHb Est-mCnc: 157 mg/dL
Hgb A1c MFr Bld: 7.1 % — ABNORMAL HIGH (ref 4.8–5.6)

## 2023-02-02 LAB — PSA: Prostate Specific Ag, Serum: 0.2 ng/mL (ref 0.0–4.0)

## 2023-02-02 LAB — TSH: TSH: 2.49 u[IU]/mL (ref 0.450–4.500)

## 2023-02-02 NOTE — Progress Notes (Signed)
HI Richard Rodgers. It was nice to see you yesterday.  Your lab work looks good.  Your A1c did increase to 7.1%.  Your kidney function remains abnormal.  I recommend staring a medication called Marcelline Deist which will help control your A1c and help with your kidney function.  If you agree, I will send this to the pharmacy.  No other concerns at this time. Continue with your current medication regimen.  Follow up as discussed.  Please let me know if you have any questions.

## 2023-02-05 MED ORDER — DAPAGLIFLOZIN PROPANEDIOL 10 MG PO TABS
10.0000 mg | ORAL_TABLET | Freq: Every day | ORAL | 1 refills | Status: DC
  Filled 2023-04-25: qty 90, 90d supply, fill #0
  Filled 2023-07-26: qty 60, 60d supply, fill #1

## 2023-02-05 NOTE — Progress Notes (Signed)
Medication sent to the pharmacy.

## 2023-02-05 NOTE — Addendum Note (Signed)
Addended by: Larae Grooms on: 02/05/2023 10:11 AM   Modules accepted: Orders

## 2023-03-05 ENCOUNTER — Other Ambulatory Visit: Payer: Self-pay

## 2023-03-06 ENCOUNTER — Other Ambulatory Visit: Payer: Self-pay

## 2023-03-06 ENCOUNTER — Encounter: Payer: Self-pay | Admitting: Pharmacist

## 2023-03-07 ENCOUNTER — Other Ambulatory Visit: Payer: Self-pay

## 2023-03-09 ENCOUNTER — Other Ambulatory Visit: Payer: Self-pay

## 2023-03-14 ENCOUNTER — Other Ambulatory Visit: Payer: Self-pay

## 2023-03-19 ENCOUNTER — Other Ambulatory Visit: Payer: Self-pay

## 2023-03-26 ENCOUNTER — Other Ambulatory Visit: Payer: Self-pay

## 2023-04-02 ENCOUNTER — Other Ambulatory Visit: Payer: Self-pay

## 2023-04-13 ENCOUNTER — Other Ambulatory Visit: Payer: Self-pay

## 2023-04-24 ENCOUNTER — Other Ambulatory Visit: Payer: Self-pay

## 2023-04-25 ENCOUNTER — Other Ambulatory Visit: Payer: Self-pay

## 2023-05-01 ENCOUNTER — Other Ambulatory Visit: Payer: Self-pay | Admitting: Nurse Practitioner

## 2023-05-01 ENCOUNTER — Other Ambulatory Visit: Payer: Self-pay

## 2023-05-02 ENCOUNTER — Other Ambulatory Visit: Payer: Self-pay

## 2023-05-03 ENCOUNTER — Other Ambulatory Visit: Payer: Self-pay

## 2023-05-04 ENCOUNTER — Other Ambulatory Visit: Payer: Self-pay

## 2023-05-07 ENCOUNTER — Other Ambulatory Visit: Payer: Self-pay

## 2023-05-08 ENCOUNTER — Other Ambulatory Visit: Payer: Self-pay

## 2023-05-10 ENCOUNTER — Other Ambulatory Visit: Payer: Self-pay

## 2023-05-28 ENCOUNTER — Other Ambulatory Visit: Payer: Self-pay

## 2023-05-29 MED ORDER — ROSUVASTATIN CALCIUM 10 MG PO TABS: 10.0000 mg | ORAL_TABLET | Freq: Every day | ORAL | 1 refills | Status: DC

## 2023-06-06 ENCOUNTER — Encounter: Payer: Self-pay | Admitting: Nurse Practitioner

## 2023-06-06 ENCOUNTER — Ambulatory Visit: Payer: 59 | Admitting: Nurse Practitioner

## 2023-06-06 VITALS — BP 125/73 | HR 85 | Temp 98.7°F | Ht 72.5 in | Wt 225.6 lb

## 2023-06-06 DIAGNOSIS — M545 Low back pain, unspecified: Secondary | ICD-10-CM | POA: Diagnosis not present

## 2023-06-06 LAB — URINALYSIS, ROUTINE W REFLEX MICROSCOPIC
Bilirubin, UA: NEGATIVE
Ketones, UA: NEGATIVE
Leukocytes,UA: NEGATIVE
Nitrite, UA: NEGATIVE
Protein,UA: NEGATIVE
RBC, UA: NEGATIVE
Specific Gravity, UA: 1.015 (ref 1.005–1.030)
Urobilinogen, Ur: 0.2 mg/dL (ref 0.2–1.0)
pH, UA: 5.5 (ref 5.0–7.5)

## 2023-06-06 MED ORDER — CYCLOBENZAPRINE HCL 5 MG PO TABS: 5.0000 mg | ORAL_TABLET | Freq: Three times a day (TID) | ORAL | 1 refills | Status: DC | PRN

## 2023-06-06 MED ORDER — METHYLPREDNISOLONE 4 MG PO TBPK
ORAL_TABLET | ORAL | 0 refills | Status: DC
Start: 2023-06-06 — End: 2023-09-03

## 2023-06-06 NOTE — Progress Notes (Signed)
BP 125/73   Pulse 85   Temp 98.7 F (37.1 C) (Oral)   Ht 6' 0.5" (1.842 m)   Wt 225 lb 9.6 oz (102.3 kg)   SpO2 95%   BMI 30.18 kg/m    Subjective:    Patient ID: Richard Rodgers, male    DOB: 26-Sep-1960, 62 y.o.   MRN: [id number]  HPI: Richard Rodgers is a 62 y.o. male  Chief Complaint  Patient presents with   Back Pain    Patient states he has been having bilateral low back pain for the last week. States she pain is off and on during the day, but more constant at night. Describes the pain as sharp and shooting pains.    BACK PAIN Duration:  1 week ago Mechanism of injury: unknown Location: bilateral and low back Onset: gradual Severity: 3/10 right now but gets up to a 10/10 at night Quality: sharp Frequency: constant- feels like he can feel it up in his abdomen also Radiation: none Aggravating factors: laying Alleviating factors:  moving around Status: stable Treatments attempted: none  Relief with NSAIDs?: No NSAIDs Taken Nighttime pain:  yes Paresthesias / decreased sensation:  no Bowel / bladder incontinence:  no Fevers:  no temp gets up to 99.5 in the mornings Dysuria / urinary frequency:  no Having regular bowel movements.  Relevant past medical, surgical, family and social history reviewed and updated as indicated. Interim medical history since our last visit reviewed. Allergies and medications reviewed and updated.  Review of Systems  Gastrointestinal:  Positive for abdominal pain. Negative for constipation.  Genitourinary:  Negative for difficulty urinating and dysuria.  Musculoskeletal:  Positive for back pain.    Per HPI unless specifically indicated above     Objective:    BP 125/73   Pulse 85   Temp 98.7 F (37.1 C) (Oral)   Ht 6' 0.5" (1.842 m)   Wt 225 lb 9.6 oz (102.3 kg)   SpO2 95%   BMI 30.18 kg/m   Wt Readings from Last 3 Encounters:  06/06/23 225 lb 9.6 oz (102.3 kg)  02/01/23 226 lb 6.4 oz (102.7 kg)  06/14/22 229 lb 8 oz  (104.1 kg)    Physical Exam Vitals and nursing note reviewed.  Constitutional:      General: He is not in acute distress.    Appearance: Normal appearance. He is not ill-appearing, toxic-appearing or diaphoretic.  HENT:     Head: Normocephalic.     Right Ear: External ear normal.     Left Ear: External ear normal.     Nose: Nose normal. No congestion or rhinorrhea.     Mouth/Throat:     Mouth: Mucous membranes are moist.  Eyes:     General:        Right eye: No discharge.        Left eye: No discharge.     Extraocular Movements: Extraocular movements intact.     Conjunctiva/sclera: Conjunctivae normal.     Pupils: Pupils are equal, round, and reactive to light.  Cardiovascular:     Rate and Rhythm: Normal rate and regular rhythm.     Heart sounds: No murmur heard. Pulmonary:     Effort: Pulmonary effort is normal. No respiratory distress.     Breath sounds: Normal breath sounds. No wheezing, rhonchi or rales.  Abdominal:     General: Abdomen is flat. Bowel sounds are normal.  Musculoskeletal:     Cervical back: Normal range of  motion and neck supple.     Thoracic back: Spasms and tenderness present. No swelling, edema, deformity, signs of trauma, lacerations or bony tenderness. Normal range of motion. No scoliosis.  Skin:    General: Skin is warm and dry.     Capillary Refill: Capillary refill takes less than 2 seconds.  Neurological:     General: No focal deficit present.     Mental Status: He is alert and oriented to person, place, and time.  Psychiatric:        Mood and Affect: Mood normal.        Behavior: Behavior normal.        Thought Content: Thought content normal.        Judgment: Judgment normal.     Results for orders placed or performed in visit on 02/01/23  TSH  Result Value Ref Range   TSH 2.490 0.450 - 4.500 uIU/mL  PSA  Result Value Ref Range   Prostate Specific Ag, Serum 0.2 0.0 - 4.0 ng/mL  Lipid panel  Result Value Ref Range   Cholesterol,  Total 146 100 - 199 mg/dL   Triglycerides 161 0 - 149 mg/dL   HDL 30 (L) >09 mg/dL   VLDL Cholesterol Cal 22 5 - 40 mg/dL   LDL Chol Calc (NIH) 94 0 - 99 mg/dL   Chol/HDL Ratio 4.9 0.0 - 5.0 ratio  CBC with Differential/Platelet  Result Value Ref Range   WBC 5.6 3.4 - 10.8 x10E3/uL   RBC 4.20 4.14 - 5.80 x10E6/uL   Hemoglobin 12.8 (L) 13.0 - 17.7 g/dL   Hematocrit 60.4 54.0 - 51.0 %   MCV 95 79 - 97 fL   MCH 30.5 26.6 - 33.0 pg   MCHC 32.0 31.5 - 35.7 g/dL   RDW 98.1 19.1 - 47.8 %   Platelets 199 150 - 450 x10E3/uL   Neutrophils 57 Not Estab. %   Lymphs 31 Not Estab. %   Monocytes 9 Not Estab. %   Eos 3 Not Estab. %   Basos 0 Not Estab. %   Neutrophils Absolute 3.2 1.4 - 7.0 x10E3/uL   Lymphocytes Absolute 1.7 0.7 - 3.1 x10E3/uL   Monocytes Absolute 0.5 0.1 - 0.9 x10E3/uL   EOS (ABSOLUTE) 0.2 0.0 - 0.4 x10E3/uL   Basophils Absolute 0.0 0.0 - 0.2 x10E3/uL   Immature Granulocytes 0 Not Estab. %   Immature Grans (Abs) 0.0 0.0 - 0.1 x10E3/uL  Comprehensive metabolic panel  Result Value Ref Range   Glucose 111 (H) 70 - 99 mg/dL   BUN 19 8 - 27 mg/dL   Creatinine, Ser 2.95 (H) 0.76 - 1.27 mg/dL   eGFR 53 (L) >62 ZH/YQM/5.78   BUN/Creatinine Ratio 13 10 - 24   Sodium 138 134 - 144 mmol/L   Potassium 4.7 3.5 - 5.2 mmol/L   Chloride 101 96 - 106 mmol/L   CO2 24 20 - 29 mmol/L   Calcium 9.2 8.6 - 10.2 mg/dL   Total Protein 7.3 6.0 - 8.5 g/dL   Albumin 4.4 3.9 - 4.9 g/dL   Globulin, Total 2.9 1.5 - 4.5 g/dL   Bilirubin Total 0.2 0.0 - 1.2 mg/dL   Alkaline Phosphatase 114 44 - 121 IU/L   AST 28 0 - 40 IU/L   ALT 32 0 - 44 IU/L  Urinalysis, Routine w reflex microscopic  Result Value Ref Range   Specific Gravity, UA 1.015 1.005 - 1.030   pH, UA 5.5 5.0 - 7.5   Color,  UA Yellow Yellow   Appearance Ur Clear Clear   Leukocytes,UA Negative Negative   Protein,UA Negative Negative/Trace   Glucose, UA Negative Negative   Ketones, UA Negative Negative   RBC, UA Negative Negative    Bilirubin, UA Negative Negative   Urobilinogen, Ur 0.2 0.2 - 1.0 mg/dL   Nitrite, UA Negative Negative   Microscopic Examination Comment   HgB A1c  Result Value Ref Range   Hgb A1c MFr Bld 7.1 (H) 4.8 - 5.6 %   Est. average glucose Bld gHb Est-mCnc 157 mg/dL  Microalbumin, Urine Waived  Result Value Ref Range   Microalb, Ur Waived 30 (H) 0 - 19 mg/L   Creatinine, Urine Waived 100 10 - 300 mg/dL   Microalb/Creat Ratio <30 <30 mg/g      Assessment & Plan:   Problem List Items Addressed This Visit   None Visit Diagnoses     Acute bilateral low back pain without sciatica    -  Primary   Will treat with flexeril at bedtime. Will also give prednisone taper. Stretches given to help with pain.  UA unremarkable in office.   Relevant Medications   cyclobenzaprine (FLEXERIL) 5 MG tablet   methylPREDNISolone (MEDROL DOSEPAK) 4 MG TBPK tablet   Other Relevant Orders   Urinalysis, Routine w reflex microscopic        Follow up plan: Return if symptoms worsen or fail to improve.

## 2023-07-11 ENCOUNTER — Other Ambulatory Visit: Payer: Self-pay

## 2023-07-16 DIAGNOSIS — H524 Presbyopia: Secondary | ICD-10-CM | POA: Diagnosis not present

## 2023-07-26 ENCOUNTER — Other Ambulatory Visit: Payer: Self-pay

## 2023-07-29 ENCOUNTER — Other Ambulatory Visit: Payer: Self-pay | Admitting: Nurse Practitioner

## 2023-07-31 NOTE — Telephone Encounter (Signed)
Requested Prescriptions  Pending Prescriptions Disp Refills   sildenafil (VIAGRA) 100 MG tablet [Pharmacy Med Name: SILDENAFIL 100 MG TABLET] 5 tablet 7    Sig: TAKE 1/2 - 1 TABLET BY MOUTH DAILY AS NEEDED FOR ERECTILE DYSFUNCTION     Urology: Erectile Dysfunction Agents Passed - 07/31/2023  8:33 AM      Passed - AST in normal range and within 360 days    AST  Date Value Ref Range Status  02/01/2023 28 0 - 40 IU/L Final   AST (SGOT) Piccolo, Waived  Date Value Ref Range Status  02/06/2018 31 11 - 38 U/L Final         Passed - ALT in normal range and within 360 days    ALT  Date Value Ref Range Status  02/01/2023 32 0 - 44 IU/L Final   ALT (SGPT) Piccolo, Waived  Date Value Ref Range Status  02/06/2018 36 10 - 47 U/L Final         Passed - Last BP in normal range    BP Readings from Last 1 Encounters:  06/06/23 125/73         Passed - Valid encounter within last 12 months    Recent Outpatient Visits           1 month ago Acute bilateral low back pain without sciatica   [place] [facility] Larae Grooms, NP   6 months ago Annual physical exam   [place] [facility] Larae G[facility], NP   1 year ago Type 2 diabetes with complication [facility])   [place] [facility] Larae Grooms, NP   1 year ago Need for influenza vaccination   [place] [facility] Mecum, Oswaldo Conroy, PA-C   1 year ago Rash in adult   [place] [facility] Mecum, Oswaldo Conroy, PA-C       Future Appointments             In 3 days Larae Grooms, NP [place] [facility]

## 2023-08-03 ENCOUNTER — Ambulatory Visit: Payer: 59 | Admitting: Nurse Practitioner

## 2023-08-10 ENCOUNTER — Other Ambulatory Visit: Payer: Self-pay | Admitting: Nurse Practitioner

## 2023-08-10 DIAGNOSIS — I10 Essential (primary) hypertension: Secondary | ICD-10-CM

## 2023-08-12 ENCOUNTER — Other Ambulatory Visit: Payer: Self-pay | Admitting: Nurse Practitioner

## 2023-08-12 ENCOUNTER — Other Ambulatory Visit: Payer: Self-pay

## 2023-08-12 DIAGNOSIS — I10 Essential (primary) hypertension: Secondary | ICD-10-CM

## 2023-08-14 ENCOUNTER — Other Ambulatory Visit: Payer: Self-pay

## 2023-08-14 NOTE — Telephone Encounter (Signed)
 Requested medication (s) are due for refill today: yes  Requested medication (s) are on the active medication list: yes  Last refill:  farxiga  02/05/23 #90/1, Valsartan  02/01/23 #90/1  Future visit scheduled: yes  Notes to clinic:  Unable to refill per protocol due to failed labs, no updated results.    Requested Prescriptions  Pending Prescriptions Disp Refills   FARXIGA  10 MG TABS tablet [Pharmacy Med Name: dapagliflozin  propanediol (FARXIGA ) 10 MG Tab tablet] 90 tablet 1    Sig: Take 1 tablet (10 mg total) by mouth daily before breakfast.     Endocrinology:  Diabetes - SGLT2 Inhibitors Failed - 08/14/2023  5:28 PM      Failed - Cr in normal range and within 360 days    Creatinine  Date Value Ref Range Status  12/01/2013 1.32 (H) 0.60 - 1.30 mg/dL Final   Creatinine, Ser  Date Value Ref Range Status  02/01/2023 1.49 (H) 0.76 - 1.27 mg/dL Final         Failed - HBA1C is between 0 and 7.9 and within 180 days    HB A1C (BAYER DCA - WAIVED)  Date Value Ref Range Status  01/24/2021 6.3 <7.0 % Final    Comment:                                          Diabetic Adult            <7.0                                       Healthy Adult        4.3 - 5.7                                                           (DCCT/NGSP) American Diabetes Association's Summary of Glycemic Recommendations for Adults with Diabetes: Hemoglobin A1c <7.0%. More stringent glycemic goals (A1c <6.0%) may further reduce complications at the cost of increased risk of hypoglycemia.    Hgb A1c MFr Bld  Date Value Ref Range Status  02/01/2023 7.1 (H) 4.8 - 5.6 % Final    Comment:             Prediabetes: 5.7 - 6.4          Diabetes: >6.4          Glycemic control for adults with diabetes: <7.0          Failed - eGFR in normal range and within 360 days    EGFR (African American)  Date Value Ref Range Status  12/01/2013 >60  Final   GFR calc Af Amer  Date Value Ref Range Status  01/27/2019 65 >59  mL/min/1.73 Final   EGFR (Non-African Amer.)  Date Value Ref Range Status  12/01/2013 >60  Final    Comment:    eGFR values <66mL/min/1.73 m2 may be an indication of chronic kidney disease (CKD). Calculated eGFR is useful in patients with stable renal function. The eGFR calculation will not be reliable in acutely ill patients when serum creatinine is changing rapidly. It is not useful in  patients on dialysis. The eGFR calculation may not be applicable to patients at the low and high extremes of body sizes, pregnant women, and vegetarians.    GFR calc non Af Amer  Date Value Ref Range Status  01/27/2019 56 (L) >59 mL/min/1.73 Final   eGFR  Date Value Ref Range Status  02/01/2023 53 (L) >59 mL/min/1.73 Final         Failed - Valid encounter within last 6 months    Recent Outpatient Visits           2 months ago Acute bilateral low back pain without sciatica   [place] [facility] Melvin Pao, NP   6 months ago Annual physical exam   [place] [facility] Melvin Pao, NP   1 year ago Type 2 diabetes with complication [facility])   [place] [facility] Melvin Pao, NP   1 year ago Need for influenza vaccination   [place] [facility] Mecum, Rocky BRAVO, PA-C   1 year ago Rash in adult   [place] [facility] Mecum, Rocky BRAVO, PA-C       Future Appointments             In 2 weeks Melvin Pao, NP [place] [facility], [facility]             valsartan  (DIOVAN ) 40 MG tablet 90 tablet 1    Sig: Take 1 tablet (40 mg total) by mouth daily.     Cardiovascular:  Angiotensin Receptor Blockers Failed - 08/14/2023  5:28 PM      Failed - Cr in normal range and within 180 days    Creatinine  Date Value Ref Range Status  12/01/2013 1.32 (H) 0.60 - 1.30 mg/dL Final   Creatinine, Ser  Date Value Ref Range Status  02/01/2023 1.49 (H) 0.76 - 1.27 mg/dL Final         Failed - K  in normal range and within 180 days    Potassium  Date Value Ref Range Status  02/01/2023 4.7 3.5 - 5.2 mmol/L Final  12/01/2013 4.7 3.5 - 5.1 mmol/L Final         Failed - Valid encounter within last 6 months    Recent Outpatient Visits           2 months ago Acute bilateral low back pain without sciatica   [place] [facility] Melvin Pao, NP   6 months ago Annual physical exam   [place] [facility] Melvin Pao, NP   1 year ago Type 2 diabetes with complication [facility])   [place] [facility] Melvin Pao, NP   1 year ago Need for influenza vaccination   [place] [facility] Mecum, Erin E, PA-C   1 year ago Rash in adult   [place] [facility] Mecum, Rocky BRAVO, PA-C       Future Appointments             In 2 weeks Melvin Pao, NP [place] [facility], [facility]            Passed - Patient is not pregnant      Passed - Last BP in normal range    BP Readings from Last 1 Encounters:  06/06/23 125/73

## 2023-08-15 ENCOUNTER — Other Ambulatory Visit: Payer: Self-pay

## 2023-08-15 MED FILL — Dapagliflozin Propanediol Tab 10 MG (Base Equivalent): ORAL | 90 days supply | Qty: 90 | Fill #0 | Status: CN

## 2023-08-15 MED FILL — Valsartan Tab 40 MG: ORAL | 90 days supply | Qty: 90 | Fill #0 | Status: CN

## 2023-08-22 ENCOUNTER — Other Ambulatory Visit: Payer: Self-pay

## 2023-08-23 ENCOUNTER — Other Ambulatory Visit: Payer: Self-pay

## 2023-08-23 ENCOUNTER — Other Ambulatory Visit: Payer: Self-pay | Admitting: Nurse Practitioner

## 2023-08-23 MED ORDER — SILDENAFIL CITRATE 100 MG PO TABS
ORAL_TABLET | ORAL | 7 refills | Status: DC
  Filled 2023-08-23: qty 5, 30d supply, fill #0

## 2023-08-23 NOTE — Telephone Encounter (Signed)
Requested Prescriptions  Pending Prescriptions Disp Refills   sildenafil (VIAGRA) 100 MG tablet 5 tablet 7    Sig: TAKE 0.5-1 TABLETS BY MOUTH DAILY AS NEEDED FOR ERECTILE DYSFUNCTION.     Urology: Erectile Dysfunction Agents Passed - 08/23/2023  1:46 PM      Passed - AST in normal range and within 360 days    AST  Date Value Ref Range Status  02/01/2023 28 0 - 40 IU/L Final   AST (SGOT) Piccolo, Waived  Date Value Ref Range Status  02/06/2018 31 11 - 38 U/L Final         Passed - ALT in normal range and within 360 days    ALT  Date Value Ref Range Status  02/01/2023 32 0 - 44 IU/L Final   ALT (SGPT) Piccolo, Waived  Date Value Ref Range Status  02/06/2018 36 10 - 47 U/L Final         Passed - Last BP in normal range    BP Readings from Last 1 Encounters:  06/06/23 125/73         Passed - Valid encounter within last 12 months    Recent Outpatient Visits           2 months ago Acute bilateral low back pain without sciatica   [place] [facility] Larae Grooms, NP   6 months ago Annual physical exam   [place] Larae Grooms, NP   1 year ago Type 2 diabetes with complication [facility])   [place] [facility] Larae Grooms, NP   1 year ago Need for influenza vaccination   [place] [facility] Mecum, Oswaldo Conroy, PA-C   1 year ago Rash in adult   [place] [facility] Mecum, Oswaldo Conroy, PA-C       Future Appointments             In 1 week Larae Grooms, NP [place] [facility]

## 2023-09-03 ENCOUNTER — Ambulatory Visit: Payer: 59 | Admitting: Nurse Practitioner

## 2023-09-03 ENCOUNTER — Encounter: Payer: Self-pay | Admitting: Nurse Practitioner

## 2023-09-03 ENCOUNTER — Telehealth: Payer: Self-pay

## 2023-09-03 VITALS — BP 129/89 | HR 89 | Temp 98.5°F | Ht 72.5 in | Wt 230.4 lb

## 2023-09-03 DIAGNOSIS — E118 Type 2 diabetes mellitus with unspecified complications: Secondary | ICD-10-CM

## 2023-09-03 DIAGNOSIS — Z6830 Body mass index (BMI) 30.0-30.9, adult: Secondary | ICD-10-CM | POA: Diagnosis not present

## 2023-09-03 DIAGNOSIS — E119 Type 2 diabetes mellitus without complications: Secondary | ICD-10-CM | POA: Diagnosis not present

## 2023-09-03 DIAGNOSIS — E7841 Elevated Lipoprotein(a): Secondary | ICD-10-CM

## 2023-09-03 DIAGNOSIS — Z7984 Long term (current) use of oral hypoglycemic drugs: Secondary | ICD-10-CM

## 2023-09-03 DIAGNOSIS — Z23 Encounter for immunization: Secondary | ICD-10-CM | POA: Diagnosis not present

## 2023-09-03 DIAGNOSIS — I10 Essential (primary) hypertension: Secondary | ICD-10-CM | POA: Diagnosis not present

## 2023-09-03 MED ORDER — VALSARTAN 40 MG PO TABS: 40.0000 mg | ORAL_TABLET | Freq: Every day | ORAL | 1 refills | Status: DC

## 2023-09-03 MED ORDER — DAPAGLIFLOZIN PROPANEDIOL 10 MG PO TABS: 10.0000 mg | ORAL_TABLET | Freq: Every day | ORAL | 1 refills | Status: DC

## 2023-09-03 MED ORDER — ROSUVASTATIN CALCIUM 10 MG PO TABS: 10.0000 mg | ORAL_TABLET | Freq: Every day | ORAL | 1 refills | Status: DC

## 2023-09-03 MED ORDER — SILDENAFIL CITRATE 100 MG PO TABS: ORAL_TABLET | ORAL | 7 refills | Status: DC

## 2023-09-03 NOTE — Progress Notes (Signed)
BP 129/89 (BP Location: Left Arm, Patient Position: Sitting, Cuff Size: Large)   Pulse 89   Temp 98.5 F (36.9 C) (Oral)   Ht 6' 0.5" (1.842 m)   Wt 230 lb 6.4 oz (104.5 kg)   SpO2 96%   BMI 30.82 kg/m    Subjective:    Patient ID: Richard Rodgers, male    DOB: [birth year], 63 y.o.   MRN: [id number]  HPI: Richard Rodgers is a 63 y.o. male  Chief Complaint  Patient presents with   Hypertension   Diabetes   DIABETES Hypoglycemic episodes:no Polydipsia/polyuria: no Visual disturbance: no Chest pain: no Paresthesias: no Glucose Monitoring: yes  Accucheck frequency: daily  Fasting glucose: yes  Post prandial:  Evening:  Before meals: Taking Insulin?: no  Long acting insulin:  Short acting insulin: Blood Pressure Monitoring: daily Retinal Examination: up to date Foot Exam: Up to Date Diabetic Education: Not Completed Pneumovax: Not up to Date Influenza: Up to Date Aspirin: no  HYPERTENSION / HYPERLIPIDEMIA Satisfied with current treatment? yes Duration of hypertension: years BP monitoring frequency: daily BP range: 128/89 BP medication side effects: no Past BP meds: valsartan Duration of hyperlipidemia: years Cholesterol medication side effects: no Cholesterol supplements: none Past cholesterol medications: none Medication compliance: excellent compliance Aspirin: no Recent stressors: no Recurrent headaches: no Visual changes: no Palpitations: no Dyspnea: no Chest pain: no Lower extremity edema: no Dizzy/lightheaded: no   The 10-year ASCVD risk score (Arnett DK, et al., 2019) is: 28.4%   Values used to calculate the score:     Age: 51 years     Sex: Male     Is Non-Hispanic African American: Yes     Diabetic: Yes     Tobacco smoker: No     Systolic Blood Pressure: 129 mmHg     Is BP treated: Yes     HDL Cholesterol: 30 mg/dL     Total Cholesterol: 146 mg/dL  Relevant past medical, surgical, family and social history reviewed and updated as  indicated. Interim medical history since our last visit reviewed. Allergies and medications reviewed and updated.  Review of Systems  Eyes:  Negative for visual disturbance.  Respiratory:  Negative for chest tightness and shortness of breath.   Cardiovascular:  Negative for chest pain, palpitations and leg swelling.  Endocrine: Negative for polydipsia and polyuria.  Neurological:  Negative for dizziness, light-headedness, numbness and headaches.    Per HPI unless specifically indicated above     Objective:    BP 129/89 (BP Location: Left Arm, Patient Position: Sitting, Cuff Size: Large)   Pulse 89   Temp 98.5 F (36.9 C) (Oral)   Ht 6' 0.5" (1.842 m)   Wt 230 lb 6.4 oz (104.5 kg)   SpO2 96%   BMI 30.82 kg/m   Wt Readings from Last 3 Encounters:  09/03/23 230 lb 6.4 oz (104.5 kg)  06/06/23 225 lb 9.6 oz (102.3 kg)  02/01/23 226 lb 6.4 oz (102.7 kg)    Physical Exam Vitals and nursing note reviewed.  Constitutional:      General: He is not in acute distress.    Appearance: Normal appearance. He is not ill-appearing, toxic-appearing or diaphoretic.  HENT:     Head: Normocephalic.     Right Ear: External ear normal.     Left Ear: External ear normal.     Nose: Nose normal. No congestion or rhinorrhea.     Mouth/Throat:     Mouth: Mucous membranes are moist.  Eyes:     General:        Right eye: No discharge.        Left eye: No discharge.     Extraocular Movements: Extraocular movements intact.     Conjunctiva/sclera: Conjunctivae normal.     Pupils: Pupils are equal, round, and reactive to light.  Cardiovascular:     Rate and Rhythm: Normal rate and regular rhythm.     Heart sounds: No murmur heard. Pulmonary:     Effort: Pulmonary effort is normal. No respiratory distress.     Breath sounds: Normal breath sounds. No wheezing, rhonchi or rales.  Abdominal:     General: Abdomen is flat. Bowel sounds are normal.  Musculoskeletal:     Cervical back: Normal range of  motion and neck supple.  Skin:    General: Skin is warm and dry.     Capillary Refill: Capillary refill takes less than 2 seconds.  Neurological:     General: No focal deficit present.     Mental Status: He is alert and oriented to person, place, and time.  Psychiatric:        Mood and Affect: Mood normal.        Behavior: Behavior normal.        Thought Content: Thought content normal.        Judgment: Judgment normal.     Results for orders placed or performed in visit on 06/06/23  Urinalysis, Routine w reflex microscopic   Collection Time: 06/06/23  2:26 PM  Result Value Ref Range   Specific Gravity, UA 1.015 1.005 - 1.030   pH, UA 5.5 5.0 - 7.5   Color, UA Yellow Yellow   Appearance Ur Clear Clear   Leukocytes,UA Negative Negative   Protein,UA Negative Negative/Trace   Glucose, UA 2+ (A) Negative   Ketones, UA Negative Negative   RBC, UA Negative Negative   Bilirubin, UA Negative Negative   Urobilinogen, Ur 0.2 0.2 - 1.0 mg/dL   Nitrite, UA Negative Negative   Microscopic Examination Comment       Assessment & Plan:   Problem List Items Addressed This Visit       Cardiovascular and Mediastinum   Primary hypertension   Chronic.  Controlled.  Continue with current medication regimen of Valsartan 40mg  daily.  Refills sent today.  Continue to check blood pressures at home.  Labs ordered today.  Return to clinic in 6 months for reevaluation.  Call sooner if concerns arise.       Relevant Medications   sildenafil (VIAGRA) 100 MG tablet   rosuvastatin (CRESTOR) 10 MG tablet   valsartan (DIOVAN) 40 MG tablet     Endocrine   Type 2 diabetes with complication (HCC) - Primary   Chronic.  Controlled without medication.  Microalbumin up to date.  Last A1c was 7.1%.  Eye exam requested from [facility].  Labs ordered today.  Return to clinic in 6 months for reevaluation.  Call sooner if concerns arise.       Relevant Medications   rosuvastatin (CRESTOR) 10 MG tablet    dapagliflozin propanediol (FARXIGA) 10 MG TABS tablet   valsartan (DIOVAN) 40 MG tablet   Other Relevant Orders   Hemoglobin A1c   Comprehensive metabolic panel   Diabetes mellitus treated with oral medication (HCC)   Relevant Medications   rosuvastatin (CRESTOR) 10 MG tablet   dapagliflozin propanediol (FARXIGA) 10 MG TABS tablet   valsartan (DIOVAN) 40 MG tablet  Other   Hyperlipidemia   Chronic.  Continue Crestor 10mg .  Reviewed with patient during visit today.  Medication sent in to the pharmacy.  Labs ordered today.  Return to clinic in 6 months for reevaluation.  Call sooner if concerns arise.       Relevant Medications   sildenafil (VIAGRA) 100 MG tablet   rosuvastatin (CRESTOR) 10 MG tablet   valsartan (DIOVAN) 40 MG tablet   Other Relevant Orders   Lipid Panel w/o Chol/HDL Ratio   BMI 30.0-30.9,adult   Recommended eating smaller high protein, low fat meals more frequently and exercising 30 mins a day 5 times a week with a goal of 10-15lb weight loss in the next 3 months.       Other Visit Diagnoses       Need for vaccination against Streptococcus pneumoniae       Relevant Orders   Pneumococcal conjugate vaccine 20-valent (Prevnar 20) (Completed)        Follow up plan: Return in about 6 months (around 03/02/2024) for Physical and Fasting labs.

## 2023-09-03 NOTE — Assessment & Plan Note (Signed)
Recommended eating smaller high protein, low fat meals more frequently and exercising 30 mins a day 5 times a week with a goal of 10-15lb weight loss in the next 3 months.

## 2023-09-03 NOTE — Assessment & Plan Note (Signed)
Chronic.  Controlled.  Continue with current medication regimen of Valsartan 40mg  daily.  Refills sent today.  Continue to check blood pressures at home.  Labs ordered today.  Return to clinic in 6 months for reevaluation.  Call sooner if concerns arise.

## 2023-09-03 NOTE — Assessment & Plan Note (Signed)
Chronic.  Controlled without medication.  Microalbumin up to date.  Last A1c was 7.1%.  Eye exam requested from [facility].  Labs ordered today.  Return to clinic in 6 months for reevaluation.  Call sooner if concerns arise.

## 2023-09-03 NOTE — Telephone Encounter (Signed)
-----   Message from Larae Grooms sent at 09/03/2023  9:58 AM EST ----- Can we request his eye exam from [facility]?

## 2023-09-03 NOTE — Assessment & Plan Note (Signed)
Chronic.  Continue Crestor 10mg .  Reviewed with patient during visit today.  Medication sent in to the pharmacy.  Labs ordered today.  Return to clinic in 6 months for reevaluation.  Call sooner if concerns arise.

## 2023-09-03 NOTE — Telephone Encounter (Signed)
Patient most recent Diabetic Eye Exam has been requested.

## 2023-09-04 ENCOUNTER — Encounter: Payer: Self-pay | Admitting: Nurse Practitioner

## 2023-09-04 LAB — COMPREHENSIVE METABOLIC PANEL
ALT: 37 [IU]/L (ref 0–44)
AST: 31 [IU]/L (ref 0–40)
Albumin: 4.3 g/dL (ref 3.9–4.9)
Alkaline Phosphatase: 109 [IU]/L (ref 44–121)
BUN/Creatinine Ratio: 7 — ABNORMAL LOW (ref 10–24)
BUN: 10 mg/dL (ref 8–27)
Bilirubin Total: <0.2 mg/dL (ref 0.0–1.2)
CO2: 26 mmol/L (ref 20–29)
Calcium: 9.5 mg/dL (ref 8.6–10.2)
Chloride: 104 mmol/L (ref 96–106)
Creatinine, Ser: 1.53 mg/dL — ABNORMAL HIGH (ref 0.76–1.27)
Globulin, Total: 2.8 g/dL (ref 1.5–4.5)
Glucose: 114 mg/dL — ABNORMAL HIGH (ref 70–99)
Potassium: 4.4 mmol/L (ref 3.5–5.2)
Sodium: 142 mmol/L (ref 134–144)
Total Protein: 7.1 g/dL (ref 6.0–8.5)
eGFR: 51 mL/min/{1.73_m2} — ABNORMAL LOW (ref >59)

## 2023-09-04 LAB — LIPID PANEL W/O CHOL/HDL RATIO
Cholesterol, Total: 155 mg/dL (ref 100–199)
HDL: 31 mg/dL — ABNORMAL LOW (ref >39)
LDL Chol Calc (NIH): 88 mg/dL (ref 0–99)
Triglycerides: 209 mg/dL — ABNORMAL HIGH (ref 0–149)
VLDL Cholesterol Cal: 36 mg/dL (ref 5–40)

## 2023-09-04 LAB — HEMOGLOBIN A1C
Est. average glucose Bld gHb Est-mCnc: 157 mg/dL
Hgb A1c MFr Bld: 7.1 % — ABNORMAL HIGH (ref 4.8–5.6)

## 2023-09-21 ENCOUNTER — Other Ambulatory Visit: Payer: Self-pay | Admitting: Nurse Practitioner

## 2023-09-21 ENCOUNTER — Other Ambulatory Visit: Payer: Self-pay

## 2023-09-23 ENCOUNTER — Other Ambulatory Visit: Payer: Self-pay

## 2023-09-23 ENCOUNTER — Other Ambulatory Visit: Payer: Self-pay | Admitting: Nurse Practitioner

## 2023-09-24 ENCOUNTER — Other Ambulatory Visit: Payer: Self-pay

## 2023-09-24 NOTE — Telephone Encounter (Signed)
 Requested Prescriptions  Refused Prescriptions Disp Refills   FARXIGA 10 MG TABS tablet [Pharmacy Med Name: dapagliflozin propanediol (FARXIGA) 10 MG Tab tablet] 90 tablet 1    Sig: Take 1 tablet (10 mg total) by mouth daily before breakfast.     Endocrinology:  Diabetes - SGLT2 Inhibitors Failed - 09/24/2023  5:54 PM      Failed - Cr in normal range and within 360 days    Creatinine  Date Value Ref Range Status  12/01/2013 1.32 (H) 0.60 - 1.30 mg/dL Final   Creatinine, Ser  Date Value Ref Range Status  09/03/2023 1.53 (H) 0.76 - 1.27 mg/dL Final         Failed - eGFR in normal range and within 360 days    EGFR (African American)  Date Value Ref Range Status  12/01/2013 >60  Final   GFR calc Af Amer  Date Value Ref Range Status  01/27/2019 65 >59 mL/min/1.73 Final   EGFR (Non-African Amer.)  Date Value Ref Range Status  12/01/2013 >60  Final    Comment:    eGFR values <71mL/min/1.73 m2 may be an indication of chronic kidney disease (CKD). Calculated eGFR is useful in patients with stable renal function. The eGFR calculation will not be reliable in acutely ill patients when serum creatinine is changing rapidly. It is not useful in  patients on dialysis. The eGFR calculation may not be applicable to patients at the low and high extremes of body sizes, pregnant women, and vegetarians.    GFR calc non Af Amer  Date Value Ref Range Status  01/27/2019 56 (L) >59 mL/min/1.73 Final   eGFR  Date Value Ref Range Status  09/03/2023 51 (L) >59 mL/min/1.73 Final         Passed - HBA1C is between 0 and 7.9 and within 180 days    HB A1C (BAYER DCA - WAIVED)  Date Value Ref Range Status  01/24/2021 6.3 <7.0 % Final    Comment:                                          Diabetic Adult            <7.0                                       Healthy Adult        4.3 - 5.7                                                           (DCCT/NGSP) American Diabetes Association's Summary of  Glycemic Recommendations for Adults with Diabetes: Hemoglobin A1c <7.0%. More stringent glycemic goals (A1c <6.0%) may further reduce complications at the cost of increased risk of hypoglycemia.    Hgb A1c MFr Bld  Date Value Ref Range Status  09/03/2023 7.1 (H) 4.8 - 5.6 % Final    Comment:             Prediabetes: 5.7 - 6.4          Diabetes: >6.4          Glycemic  control for adults with diabetes: <7.0          Passed - Valid encounter within last 6 months    Recent Outpatient Visits           3 weeks ago Type 2 diabetes with complication [facility])   [place] [facility] Larae Grooms, NP   3 months ago Acute bilateral low back pain without sciatica   [place] [facility] Larae Grooms, NP   7 months ago Annual physical exam   [place] [facility] Larae Grooms, NP   1 year ago Type 2 diabetes with complication [facility])   [place] [facility] Larae Grooms, NP   1 year ago Need for influenza vaccination   [place] [facility] Mecum, Oswaldo Conroy, PA-C       Future Appointments             In 5 months Larae Grooms, NP  [facility]

## 2023-10-03 ENCOUNTER — Other Ambulatory Visit: Payer: Self-pay

## 2023-10-03 ENCOUNTER — Other Ambulatory Visit: Payer: Self-pay | Admitting: Nurse Practitioner

## 2023-10-03 DIAGNOSIS — I10 Essential (primary) hypertension: Secondary | ICD-10-CM

## 2023-10-03 NOTE — Telephone Encounter (Signed)
 Patient requesting to have prescription sent to [facility].

## 2023-10-03 NOTE — Telephone Encounter (Signed)
 Patient requesting medication to be sent to [facility].

## 2023-10-03 NOTE — Telephone Encounter (Signed)
 Copied from CRM [id number]. Topic: Clinical - Medication Refill >> Oct 03, 2023  1:40 PM Gildardo Pounds wrote: Most Recent Primary Care Visit:  Provider: Larae Grooms  Department: [id number]  Visit Type: OFFICE VISIT  Date: 09/03/2023  Medication: valsartan (DIOVAN) 40 MG tablet rosuvastatin (CRESTOR) 10 MG tablet dapagliflozin propanediol (FARXIGA) 10 MG TABS tablet  Has the patient contacted their pharmacy? Yes (Agent: If no, request that the patient contact the pharmacy for the refill. If patient does not wish to contact the pharmacy document the reason why and proceed with request.) (Agent: If yes, when and what did the pharmacy advise?)Yes, pharmacy said to call doctor  Is this the correct pharmacy for this prescription? Yes If no, delete pharmacy and type the correct one.  This is the patient's preferred pharmacy:   [facility] [address] [place] [address] Phone: [phone] Fax: [phone]   Has the prescription been filled recently? No  Is the patient out of the medication? No  Has the patient been seen for an appointment in the last year OR does the patient have an upcoming appointment? Yes  Can we respond through MyChart? Yes  Agent: Please be advised that Rx refills may take up to 3 business days. We ask that you follow-up with your pharmacy.

## 2023-10-04 ENCOUNTER — Other Ambulatory Visit: Payer: Self-pay

## 2023-10-04 MED ORDER — ROSUVASTATIN CALCIUM 10 MG PO TABS
10.0000 mg | ORAL_TABLET | Freq: Every day | ORAL | 1 refills | Status: DC
  Filled 2023-10-04: qty 90, 90d supply, fill #0
  Filled 2024-01-02: qty 90, 90d supply, fill #1

## 2023-10-04 MED ORDER — DAPAGLIFLOZIN PROPANEDIOL 10 MG PO TABS
10.0000 mg | ORAL_TABLET | Freq: Every day | ORAL | 1 refills | Status: DC
  Filled 2023-10-04: qty 90, 90d supply, fill #0
  Filled 2024-01-04: qty 90, 90d supply, fill #1

## 2023-10-04 MED FILL — Valsartan Tab 40 MG: ORAL | 90 days supply | Qty: 90 | Fill #0 | Status: CN

## 2023-11-06 ENCOUNTER — Other Ambulatory Visit: Payer: Self-pay

## 2023-11-06 MED FILL — Valsartan Tab 40 MG: ORAL | 90 days supply | Qty: 90 | Fill #0 | Status: CN

## 2023-11-06 MED FILL — Valsartan Tab 40 MG: ORAL | 90 days supply | Qty: 90 | Fill #0 | Status: AC

## 2023-11-21 ENCOUNTER — Other Ambulatory Visit: Payer: Self-pay

## 2023-12-20 ENCOUNTER — Other Ambulatory Visit: Payer: Self-pay | Admitting: Nurse Practitioner

## 2023-12-20 ENCOUNTER — Other Ambulatory Visit: Payer: Self-pay

## 2023-12-21 ENCOUNTER — Other Ambulatory Visit: Payer: Self-pay

## 2023-12-24 ENCOUNTER — Other Ambulatory Visit: Payer: Self-pay

## 2023-12-24 MED FILL — Sildenafil Citrate Tab 100 MG: ORAL | 30 days supply | Qty: 6 | Fill #0 | Status: AC

## 2023-12-24 NOTE — Telephone Encounter (Signed)
 Requested Prescriptions  Pending Prescriptions Disp Refills   sildenafil  (VIAGRA ) 100 MG tablet 5 tablet 7    Sig: TAKE 0.5-1 TABLETS BY MOUTH DAILY AS NEEDED FOR ERECTILE DYSFUNCTION.     Urology: Erectile Dysfunction Agents Failed - 12/24/2023 11:18 AM      Failed - Valid encounter within last 12 months    Recent Outpatient Visits   None     Future Appointments             In 2 months Richard Alexanders, NP [facility]            Passed - AST in normal range and within 360 days    AST  Date Value Ref Range Status  09/03/2023 31 0 - 40 IU/L Final   AST (SGOT) Piccolo, Waived  Date Value Ref Range Status  02/06/2018 31 11 - 38 U/L Final         Passed - ALT in normal range and within 360 days    ALT  Date Value Ref Range Status  09/03/2023 37 0 - 44 IU/L Final   ALT (SGPT) Piccolo, Waived  Date Value Ref Range Status  02/06/2018 36 10 - 47 U/L Final         Passed - Last BP in normal range    BP Readings from Last 1 Encounters:  09/03/23 129/89

## 2023-12-25 ENCOUNTER — Other Ambulatory Visit: Payer: Self-pay

## 2024-01-04 ENCOUNTER — Other Ambulatory Visit: Payer: Self-pay

## 2024-02-08 MED FILL — Sildenafil Citrate Tab 100 MG: ORAL | 30 days supply | Qty: 6 | Fill #1 | Status: AC

## 2024-03-06 ENCOUNTER — Encounter: Payer: Self-pay | Admitting: Nurse Practitioner

## 2024-03-06 ENCOUNTER — Other Ambulatory Visit: Payer: Self-pay

## 2024-03-06 ENCOUNTER — Ambulatory Visit (INDEPENDENT_AMBULATORY_CARE_PROVIDER_SITE_OTHER): Payer: Self-pay | Admitting: Nurse Practitioner

## 2024-03-06 VITALS — BP 133/83 | HR 61 | Temp 98.4°F | Ht 72.8 in | Wt 224.6 lb

## 2024-03-06 DIAGNOSIS — E7841 Elevated Lipoprotein(a): Secondary | ICD-10-CM | POA: Diagnosis not present

## 2024-03-06 DIAGNOSIS — Z7984 Long term (current) use of oral hypoglycemic drugs: Secondary | ICD-10-CM | POA: Diagnosis not present

## 2024-03-06 DIAGNOSIS — Z0000 Encounter for general adult medical examination without abnormal findings: Secondary | ICD-10-CM

## 2024-03-06 DIAGNOSIS — E118 Type 2 diabetes mellitus with unspecified complications: Secondary | ICD-10-CM | POA: Diagnosis not present

## 2024-03-06 DIAGNOSIS — I10 Essential (primary) hypertension: Secondary | ICD-10-CM

## 2024-03-06 DIAGNOSIS — E119 Type 2 diabetes mellitus without complications: Secondary | ICD-10-CM | POA: Diagnosis not present

## 2024-03-06 LAB — MICROALBUMIN, URINE WAIVED
Creatinine, Urine Waived: 100 mg/dL (ref 10–300)
Microalb, Ur Waived: 10 mg/L (ref 0–19)
Microalb/Creat Ratio: <30 mg/g (ref <30)

## 2024-03-06 MED ORDER — VALSARTAN 40 MG PO TABS
40.0000 mg | ORAL_TABLET | Freq: Every day | ORAL | 1 refills | Status: DC
  Filled 2024-03-06: qty 90, 90d supply, fill #0
  Filled 2024-07-03: qty 90, 90d supply, fill #1

## 2024-03-06 MED ORDER — DAPAGLIFLOZIN PROPANEDIOL 10 MG PO TABS
10.0000 mg | ORAL_TABLET | Freq: Every day | ORAL | 1 refills | Status: DC
  Filled 2024-03-06 – 2024-04-02 (×2): qty 90, 90d supply, fill #0
  Filled 2024-07-03: qty 90, 90d supply, fill #1

## 2024-03-06 MED ORDER — ROSUVASTATIN CALCIUM 10 MG PO TABS
10.0000 mg | ORAL_TABLET | Freq: Every day | ORAL | 1 refills | Status: DC
  Filled 2024-03-06 – 2024-04-02 (×2): qty 90, 90d supply, fill #0
  Filled 2024-07-03: qty 90, 90d supply, fill #1

## 2024-03-06 NOTE — Assessment & Plan Note (Signed)
 Chronic.  Controlled without medication.  Microalbumin up to date.  Last A1c was 7.1%.  Eye exam requested from [facility].  Labs ordered today.  Return to clinic in 6 months for reevaluation.  Call sooner if concerns arise.

## 2024-03-06 NOTE — Progress Notes (Signed)
 BP 133/83   Pulse 61   Temp 98.4 F (36.9 C) (Oral)   Ht 6' 0.8 (1.849 m)   Wt 224 lb 9.6 oz (101.9 kg)   SpO2 96%   BMI 29.80 kg/m    Subjective:    Patient ID: Richard Rodgers, male    DOB: 06-21-1961, 63 y.o.   MRN: [id number]  HPI: Richard Rodgers is a 63 y.o. male presenting on 03/06/2024 for comprehensive medical examination. Current medical complaints include:none  He currently lives with: Interim Problems from his last visit: no  HYPERTENSION / HYPERLIPIDEMIA Satisfied with current treatment? yes Duration of hypertension: years BP monitoring frequency: daily BP range: 125-135/65-75 BP medication side effects: no Past BP meds: valsartan  Duration of hyperlipidemia: years Cholesterol medication side effects: no Cholesterol supplements: none Past cholesterol medications: rosuvastatin  (crestor ) Medication compliance: excellent compliance Aspirin: no Recent stressors: no Recurrent headaches: no Visual changes: no Palpitations: no Dyspnea: no Chest pain: no Lower extremity edema: no Dizzy/lightheaded: no  DIABETES Diet controlled.  Eye exam requested. Hypoglycemic episodes:no Polydipsia/polyuria: no Visual disturbance: no Chest pain: no Paresthesias: no Glucose Monitoring: no  Accucheck frequency: Not Checking  Fasting glucose:  Post prandial:  Evening:  Before meals: Taking Insulin?: no  Long acting insulin:  Short acting insulin: Blood Pressure Monitoring: daily Retinal Examination: Up to Date Foot Exam: Up to Date Diabetic Education: Not Completed Pneumovax: Up to Date Influenza: Up to Date Aspirin: no   Depression Screen done today and results listed below:     03/06/2024    8:16 AM 09/03/2023    9:50 AM 02/01/2023    8:39 AM 06/14/2022    8:45 AM 03/31/2022    8:41 AM  Depression screen PHQ 2/9  Decreased Interest 0 0 0 0 0  Down, Depressed, Hopeless 0 0 0 0 0  PHQ - 2 Score 0 0 0 0 0  Altered sleeping 0 0 0 0   Tired, decreased  energy 0 0 0 0   Change in appetite 0 0 0 0   Feeling bad or failure about yourself  0 0 0 0   Trouble concentrating 0 0 0 0   Moving slowly or fidgety/restless 0 0 0 0   Suicidal thoughts 0 0 0 0   PHQ-9 Score 0 0 0 0   Difficult doing work/chores Not difficult at all  Not difficult at all Not difficult at all     The patient does not have a history of falls. I did complete a risk assessment for falls. A plan of care for falls was documented.   Past Medical History:  Past Medical History:  Diagnosis Date   DM (diabetes mellitus) (HCC)    HTN (hypertension)    Hyperlipidemia    Vertigo     Surgical History:  Past Surgical History:  Procedure Laterality Date   COLONOSCOPY     COLONOSCOPY WITH PROPOFOL  N/A 01/07/2018   Procedure: COLONOSCOPY WITH PROPOFOL ;  Surgeon: Janalyn Keene NOVAK, MD;  Location: [facility] ENDOSCOPY;  Service: Endoscopy;  Laterality: N/A;   CYST REMOVAL LEG Left    Knee   VASECTOMY      Medications:  Current Outpatient Medications on File Prior to Visit  Medication Sig   cholecalciferol (VITAMIN D3) 25 MCG (1000 UNIT) tablet Take 1,000 Units by mouth daily.   Cyanocobalamin 5000 MCG TBDP Take 5,000 mcg by mouth daily at 2 PM.   folic acid  (FOLVITE ) 800 MCG tablet Take 800 mcg by mouth daily.  Garlic 2000 MG TBEC Take 1 tablet by mouth daily at 2 PM.   Maca 500 MG CAPS Take 1 capsule by mouth daily at 2 PM.   Omega-3 Fatty Acids (FISH OIL) 1000 MG CAPS Take 1,000 mg by mouth daily.   sildenafil  (VIAGRA ) 100 MG tablet Take 0.5-1 tablets (50-100 mg total) by mouth daily as needed for erectile dysfunction.   No current facility-administered medications on file prior to visit.    Allergies:  Allergies  Allergen Reactions   Amlodipine  Rash    Social History:  Social History   Socioeconomic History   Marital status: Married    Spouse name: Not on file   Number of children: Not on file   Years of education: Not on file   Highest education level:  Not on file  Occupational History   Not on file  Tobacco Use   Smoking status: Never   Smokeless tobacco: Never  Vaping Use   Vaping status: Never Used  Substance and Sexual Activity   Alcohol use: Never   Drug use: Never   Sexual activity: Yes  Other Topics Concern   Not on file  Social History Narrative   Not on file   Social Drivers of Health   Financial Resource Strain: Low Risk  (03/06/2024)   Overall Financial Resource Strain (CARDIA)    Difficulty of Paying Living Expenses: Not hard at all  Food Insecurity: No Food Insecurity (03/06/2024)   Hunger Vital Sign    Worried About Running Out of Food in the Last Year: Never true    Ran Out of Food in the Last Year: Never true  Transportation Needs: No Transportation Needs (03/06/2024)   PRAPARE - Administrator, Civil Service (Medical): No    Lack of Transportation (Non-Medical): No  Physical Activity: Insufficiently Active (03/06/2024)   Exercise Vital Sign    Days of Exercise per Week: 7 days    Minutes of Exercise per Session: 20 min  Stress: No Stress Concern Present (03/06/2024)   [employer] of Occupational Health - Occupational Stress Questionnaire    Feeling of Stress: Not at all  Social Connections: Moderately Integrated (03/06/2024)   Social Connection and Isolation Panel    Frequency of Communication with Friends and Family: More than three times a week    Frequency of Social Gatherings with Friends and Family: More than three times a week    Attends Religious Services: More than 4 times per year    Active Member of [employer] or Organizations: No    Attends Banker Meetings: Never    Marital Status: Married  Catering manager Violence: Not At Risk (03/06/2024)   Humiliation, Afraid, Rape, and Kick questionnaire    Fear of Current or Ex-Partner: No    Emotionally Abused: No    Physically Abused: No    Sexually Abused: No   Social History   Tobacco Use  Smoking Status Never   Smokeless Tobacco Never   Social History   Substance and Sexual Activity  Alcohol Use Never    Family History:  Family History  Problem Relation Age of Onset   Diabetes Mother    Hypertension Mother    Stroke Father    Lupus Sister    Diabetes Brother    Diabetes Maternal Grandmother     Past medical history, surgical history, medications, allergies, family history and social history reviewed with patient today and changes made to appropriate areas of the chart.   Review  of Systems  HENT:         Denies vision changes.  Eyes:  Negative for blurred vision and double vision.  Respiratory:  Negative for shortness of breath.   Cardiovascular:  Negative for chest pain, palpitations and leg swelling.  Neurological:  Negative for dizziness, tingling and headaches.  Endo/Heme/Allergies:  Negative for polydipsia.       Denies Polyuria   All other ROS negative except what is listed above and in the HPI.      Objective:    BP 133/83   Pulse 61   Temp 98.4 F (36.9 C) (Oral)   Ht 6' 0.8 (1.849 m)   Wt 224 lb 9.6 oz (101.9 kg)   SpO2 96%   BMI 29.80 kg/m   Wt Readings from Last 3 Encounters:  03/06/24 224 lb 9.6 oz (101.9 kg)  09/03/23 230 lb 6.4 oz (104.5 kg)  06/06/23 225 lb 9.6 oz (102.3 kg)    Physical Exam Vitals and nursing note reviewed.  Constitutional:      General: He is not in acute distress.    Appearance: Normal appearance. He is not ill-appearing, toxic-appearing or diaphoretic.  HENT:     Head: Normocephalic.     Right Ear: Tympanic membrane, ear canal and external ear normal.     Left Ear: Tympanic membrane, ear canal and external ear normal.     Nose: Nose normal. No congestion or rhinorrhea.     Mouth/Throat:     Mouth: Mucous membranes are moist.  Eyes:     General:        Right eye: No discharge.        Left eye: No discharge.     Extraocular Movements: Extraocular movements intact.     Conjunctiva/sclera: Conjunctivae normal.     Pupils:  Pupils are equal, round, and reactive to light.  Cardiovascular:     Rate and Rhythm: Normal rate and regular rhythm.     Heart sounds: No murmur heard. Pulmonary:     Effort: Pulmonary effort is normal. No respiratory distress.     Breath sounds: Normal breath sounds. No wheezing, rhonchi or rales.  Abdominal:     General: Abdomen is flat. Bowel sounds are normal. There is no distension.     Palpations: Abdomen is soft.     Tenderness: There is no abdominal tenderness. There is no guarding.  Musculoskeletal:     Cervical back: Normal range of motion and neck supple.  Skin:    General: Skin is warm and dry.     Capillary Refill: Capillary refill takes less than 2 seconds.  Neurological:     General: No focal deficit present.     Mental Status: He is alert and oriented to person, place, and time.     Cranial Nerves: No cranial nerve deficit.     Motor: No weakness.     Deep Tendon Reflexes: Reflexes normal.  Psychiatric:        Mood and Affect: Mood normal.        Behavior: Behavior normal.        Thought Content: Thought content normal.        Judgment: Judgment normal.     Results for orders placed or performed in visit on 09/03/23  Hemoglobin A1c   Collection Time: 09/03/23 10:18 AM  Result Value Ref Range   Hgb A1c MFr Bld 7.1 (H) 4.8 - 5.6 %   Est. average glucose Bld gHb Est-mCnc 157 mg/dL  Comprehensive metabolic panel   Collection Time: 09/03/23 10:18 AM  Result Value Ref Range   Glucose 114 (H) 70 - 99 mg/dL   BUN 10 8 - 27 mg/dL   Creatinine, Ser 8.46 (H) 0.76 - 1.27 mg/dL   eGFR 51 (L) >40 fO/fpw/8.26   BUN/Creatinine Ratio 7 (L) 10 - 24   Sodium 142 134 - 144 mmol/L   Potassium 4.4 3.5 - 5.2 mmol/L   Chloride 104 96 - 106 mmol/L   CO2 26 20 - 29 mmol/L   Calcium  9.5 8.6 - 10.2 mg/dL   Total Protein 7.1 6.0 - 8.5 g/dL   Albumin 4.3 3.9 - 4.9 g/dL   Globulin, Total 2.8 1.5 - 4.5 g/dL   Bilirubin Total <9.7 0.0 - 1.2 mg/dL   Alkaline Phosphatase 109 44 -  121 IU/L   AST 31 0 - 40 IU/L   ALT 37 0 - 44 IU/L  Lipid Panel w/o Chol/HDL Ratio   Collection Time: 09/03/23 10:18 AM  Result Value Ref Range   Cholesterol, Total 155 100 - 199 mg/dL   Triglycerides 790 (H) 0 - 149 mg/dL   HDL 31 (L) >60 mg/dL   VLDL Cholesterol Cal 36 5 - 40 mg/dL   LDL Chol Calc (NIH) 88 0 - 99 mg/dL      Assessment & Plan:   Problem List Items Addressed This Visit       Cardiovascular and Mediastinum   Primary hypertension   Chronic.  Controlled.  Continue with current medication regimen of Valsartan  40mg  daily.  Refills sent today.  Continue to check blood pressures at home. Running 120-130/80.  Labs ordered today.  Return to clinic in 6 months for reevaluation.  Call sooner if concerns arise.       Relevant Medications   rosuvastatin  (CRESTOR ) 10 MG tablet   valsartan  (DIOVAN ) 40 MG tablet     Endocrine   Type 2 diabetes with complication (HCC)   Chronic.  Controlled without medication.  Microalbumin up to date.  Last A1c was 7.1%.  Eye exam requested from [facility].  Labs ordered today.  Return to clinic in 6 months for reevaluation.  Call sooner if concerns arise.       Relevant Medications   dapagliflozin  propanediol (FARXIGA ) 10 MG TABS tablet   rosuvastatin  (CRESTOR ) 10 MG tablet   valsartan  (DIOVAN ) 40 MG tablet   Other Relevant Orders   Hemoglobin A1c   Microalbumin, Urine Waived   Diabetes mellitus treated with oral medication (HCC)   Relevant Medications   dapagliflozin  propanediol (FARXIGA ) 10 MG TABS tablet   rosuvastatin  (CRESTOR ) 10 MG tablet   valsartan  (DIOVAN ) 40 MG tablet     Other   Hyperlipidemia   Chronic.  Continue Crestor  10mg .  Reviewed with patient during visit today.  Medication sent in to the pharmacy.  Labs ordered today.  Return to clinic in 6 months for reevaluation.  Call sooner if concerns arise.       Relevant Medications   rosuvastatin  (CRESTOR ) 10 MG tablet   valsartan  (DIOVAN ) 40 MG tablet   Other  Relevant Orders   Lipid panel   Other Visit Diagnoses       Annual physical exam    -  Primary   Health maintenance reviewed during visit today.  Labs ordered.  Vaccines reviewed. Colonoscopy up to date.   Relevant Orders   TSH   PSA   Lipid panel   CBC with Differential/Platelet   Comprehensive metabolic  panel with GFR   Hemoglobin A1c   Microalbumin, Urine Waived        Discussed aspirin prophylaxis for myocardial infarction prevention and decision was it was not indicated  LABORATORY TESTING:  Health maintenance labs ordered today as discussed above.   The natural history of prostate cancer and ongoing controversy regarding screening and potential treatment outcomes of prostate cancer has been discussed with the patient. The meaning of a false positive PSA and a false negative PSA has been discussed. He indicates understanding of the limitations of this screening test and wishes to proceed with screening PSA testing.   IMMUNIZATIONS:   - Tdap: Tetanus vaccination status reviewed: last tetanus booster within 10 years. - Influenza: Postponed to flu season - Pneumovax: Not applicable - Prevnar: Not applicable - COVID: Not applicable - HPV: Not applicable - Shingrix  vaccine: Up to date  SCREENING: - Colonoscopy: Up to date  Discussed with patient purpose of the colonoscopy is to detect colon cancer at curable precancerous or early stages   - AAA Screening: Not applicable  -Hearing Test: Not applicable  -Spirometry: Not applicable   PATIENT COUNSELING:    Sexuality: Discussed sexually transmitted diseases, partner selection, use of condoms, avoidance of unintended pregnancy  and contraceptive alternatives.   Advised to avoid cigarette smoking.  I discussed with the patient that most people either abstain from alcohol or drink within safe limits (<=14/week and <=4 drinks/occasion for males, <=7/weeks and <= 3 drinks/occasion for females) and that the risk for alcohol  disorders and other health effects rises proportionally with the number of drinks per week and how often a drinker exceeds daily limits.  Discussed cessation/primary prevention of drug use and availability of treatment for abuse.   Diet: Encouraged to adjust caloric intake to maintain  or achieve ideal body weight, to reduce intake of dietary saturated fat and total fat, to limit sodium intake by avoiding high sodium foods and not adding table salt, and to maintain adequate dietary potassium and calcium  preferably from fresh fruits, vegetables, and low-fat dairy products.    stressed the importance of regular exercise  Injury prevention: Discussed safety belts, safety helmets, smoke detector, smoking near bedding or upholstery.   Dental health: Discussed importance of regular tooth brushing, flossing, and dental visits.   Follow up plan: NEXT PREVENTATIVE PHYSICAL DUE IN 1 YEAR. No follow-ups on file.

## 2024-03-06 NOTE — Assessment & Plan Note (Signed)
 Chronic.  Continue Crestor 10mg .  Reviewed with patient during visit today.  Medication sent in to the pharmacy.  Labs ordered today.  Return to clinic in 6 months for reevaluation.  Call sooner if concerns arise.

## 2024-03-06 NOTE — Assessment & Plan Note (Signed)
 Chronic.  Controlled.  Continue with current medication regimen of Valsartan  40mg  daily.  Refills sent today.  Continue to check blood pressures at home. Running 120-130/80.  Labs ordered today.  Return to clinic in 6 months for reevaluation.  Call sooner if concerns arise.

## 2024-03-07 ENCOUNTER — Ambulatory Visit: Payer: Self-pay | Admitting: Nurse Practitioner

## 2024-03-07 LAB — CBC WITH DIFFERENTIAL/PLATELET
Basophils Absolute: 0 x10E3/uL (ref 0.0–0.2)
Basos: 0 %
EOS (ABSOLUTE): 0.2 x10E3/uL (ref 0.0–0.4)
Eos: 3 %
Hematocrit: 40.7 % (ref 37.5–51.0)
Hemoglobin: 13.2 g/dL (ref 13.0–17.7)
Immature Grans (Abs): 0 x10E3/uL (ref 0.0–0.1)
Immature Granulocytes: 0 %
Lymphocytes Absolute: 1.9 x10E3/uL (ref 0.7–3.1)
Lymphs: 35 %
MCH: 30.8 pg (ref 26.6–33.0)
MCHC: 32.4 g/dL (ref 31.5–35.7)
MCV: 95 fL (ref 79–97)
Monocytes Absolute: 0.4 x10E3/uL (ref 0.1–0.9)
Monocytes: 7 %
Neutrophils Absolute: 2.9 x10E3/uL (ref 1.4–7.0)
Neutrophils: 55 %
Platelets: 190 x10E3/uL (ref 150–450)
RBC: 4.29 x10E6/uL (ref 4.14–5.80)
RDW: 14.5 % (ref 11.6–15.4)
WBC: 5.4 x10E3/uL (ref 3.4–10.8)

## 2024-03-07 LAB — LIPID PANEL
Chol/HDL Ratio: 4.9 ratio (ref 0.0–5.0)
Cholesterol, Total: 141 mg/dL (ref 100–199)
HDL: 29 mg/dL — ABNORMAL LOW (ref >39)
LDL Chol Calc (NIH): 83 mg/dL (ref 0–99)
Triglycerides: 166 mg/dL — ABNORMAL HIGH (ref 0–149)
VLDL Cholesterol Cal: 29 mg/dL (ref 5–40)

## 2024-03-07 LAB — COMPREHENSIVE METABOLIC PANEL WITH GFR
ALT: 29 IU/L (ref 0–44)
AST: 27 IU/L (ref 0–40)
Albumin: 4.3 g/dL (ref 3.9–4.9)
Alkaline Phosphatase: 101 IU/L (ref 44–121)
BUN/Creatinine Ratio: 11 (ref 10–24)
BUN: 16 mg/dL (ref 8–27)
Bilirubin Total: <0.2 mg/dL (ref 0.0–1.2)
CO2: 22 mmol/L (ref 20–29)
Calcium: 9.5 mg/dL (ref 8.6–10.2)
Chloride: 104 mmol/L (ref 96–106)
Creatinine, Ser: 1.49 mg/dL — ABNORMAL HIGH (ref 0.76–1.27)
Globulin, Total: 2.7 g/dL (ref 1.5–4.5)
Glucose: 118 mg/dL — ABNORMAL HIGH (ref 70–99)
Potassium: 4.8 mmol/L (ref 3.5–5.2)
Sodium: 141 mmol/L (ref 134–144)
Total Protein: 7 g/dL (ref 6.0–8.5)
eGFR: 52 mL/min/1.73 — ABNORMAL LOW (ref >59)

## 2024-03-07 LAB — TSH: TSH: 3.25 u[IU]/mL (ref 0.450–4.500)

## 2024-03-07 LAB — PSA: Prostate Specific Ag, Serum: 0.2 ng/mL (ref 0.0–4.0)

## 2024-03-07 LAB — HEMOGLOBIN A1C
Est. average glucose Bld gHb Est-mCnc: 148 mg/dL
Hgb A1c MFr Bld: 6.8 % — ABNORMAL HIGH (ref 4.8–5.6)

## 2024-03-09 ENCOUNTER — Other Ambulatory Visit: Payer: Self-pay

## 2024-03-09 ENCOUNTER — Other Ambulatory Visit: Payer: Self-pay | Admitting: Nurse Practitioner

## 2024-03-09 MED FILL — Sildenafil Citrate Tab 100 MG: ORAL | 30 days supply | Qty: 6 | Fill #2 | Status: CN

## 2024-03-10 ENCOUNTER — Other Ambulatory Visit: Payer: Self-pay

## 2024-03-11 ENCOUNTER — Other Ambulatory Visit: Payer: Self-pay

## 2024-03-11 MED FILL — Sildenafil Citrate Tab 100 MG: ORAL | 30 days supply | Qty: 6 | Fill #0 | Status: AC

## 2024-03-11 NOTE — Telephone Encounter (Signed)
 Requested Prescriptions  Pending Prescriptions Disp Refills   sildenafil  (VIAGRA ) 100 MG tablet 90 tablet 1    Sig: Take 0.5-1 tablets (50-100 mg total) by mouth daily as needed for erectile dysfunction.     Urology: Erectile Dysfunction Agents Passed - 03/11/2024 10:44 AM      Passed - AST in normal range and within 360 days    AST  Date Value Ref Range Status  03/06/2024 27 0 - 40 IU/L Final   AST (SGOT) Piccolo, Waived  Date Value Ref Range Status  02/06/2018 31 11 - 38 U/L Final         Passed - ALT in normal range and within 360 days    ALT  Date Value Ref Range Status  03/06/2024 29 0 - 44 IU/L Final   ALT (SGPT) Piccolo, Waived  Date Value Ref Range Status  02/06/2018 36 10 - 47 U/L Final         Passed - Last BP in normal range    BP Readings from Last 1 Encounters:  03/06/24 133/83         Passed - Valid encounter within last 12 months    Recent Outpatient Visits           5 days ago Annual physical exam   [place] Melvin Pao, NP

## 2024-04-01 MED FILL — Sildenafil Citrate Tab 100 MG: ORAL | 30 days supply | Qty: 6 | Fill #1 | Status: CN

## 2024-04-02 ENCOUNTER — Other Ambulatory Visit: Payer: Self-pay

## 2024-04-03 ENCOUNTER — Other Ambulatory Visit: Payer: Self-pay

## 2024-04-04 MED FILL — Sildenafil Citrate Tab 100 MG: ORAL | 30 days supply | Qty: 6 | Fill #1 | Status: AC

## 2024-05-04 MED FILL — Sildenafil Citrate Tab 100 MG: ORAL | 30 days supply | Qty: 6 | Fill #2 | Status: AC

## 2024-06-01 MED FILL — Sildenafil Citrate Tab 100 MG: ORAL | 30 days supply | Qty: 6 | Fill #3 | Status: AC

## 2024-06-26 ENCOUNTER — Other Ambulatory Visit (HOSPITAL_COMMUNITY): Payer: Self-pay

## 2024-07-03 MED FILL — Sildenafil Citrate Tab 100 MG: ORAL | 30 days supply | Qty: 6 | Fill #4 | Status: AC

## 2024-08-06 ENCOUNTER — Other Ambulatory Visit: Payer: Self-pay | Admitting: Nurse Practitioner

## 2024-08-26 ENCOUNTER — Other Ambulatory Visit: Payer: Self-pay | Admitting: Nurse Practitioner

## 2024-08-26 ENCOUNTER — Other Ambulatory Visit: Payer: Self-pay

## 2024-08-26 DIAGNOSIS — I10 Essential (primary) hypertension: Secondary | ICD-10-CM

## 2024-08-27 ENCOUNTER — Other Ambulatory Visit: Payer: Self-pay

## 2024-08-27 NOTE — Telephone Encounter (Signed)
 Requested medication (s) are due for refill today: requesting early  Requested medication (s) are on the active medication list: yes   Last refill:  diovan , farixga , crestor - 03/06/24 #90 1 refills, viagra - 08/06/24 #5 tablets 7 refills  Future visit scheduled: yes 09/08/24 Notes to clinic:  last dispensed 07/04/24. Do you want to refill Rxs? Early?     Requested Prescriptions  Pending Prescriptions Disp Refills   valsartan  (DIOVAN ) 40 MG tablet 90 tablet 1    Sig: Take 1 tablet (40 mg total) by mouth daily.     Cardiovascular:  Angiotensin Receptor Blockers Failed - 08/27/2024 10:16 AM      Failed - Cr in normal range and within 180 days    Creatinine  Date Value Ref Range Status  12/01/2013 1.32 (H) 0.60 - 1.30 mg/dL Final   Creatinine, Ser  Date Value Ref Range Status  03/06/2024 1.49 (H) 0.76 - 1.27 mg/dL Final         Passed - K in normal range and within 180 days    Potassium  Date Value Ref Range Status  03/06/2024 4.8 3.5 - 5.2 mmol/L Final  12/01/2013 4.7 3.5 - 5.1 mmol/L Final         Passed - Patient is not pregnant      Passed - Last BP in normal range    BP Readings from Last 1 Encounters:  03/06/24 133/83         Passed - Valid encounter within last 6 months    Recent Outpatient Visits           5 months ago Annual physical exam   [place] [facility] Melvin Pao, NP               sildenafil  (VIAGRA ) 100 MG tablet 90 tablet 1    Sig: Take 0.5-1 tablets (50-100 mg total) by mouth daily as needed for erectile dysfunction.     Urology: Erectile Dysfunction Agents Passed - 08/27/2024 10:16 AM      Passed - AST in normal range and within 360 days    AST  Date Value Ref Range Status  03/06/2024 27 0 - 40 IU/L Final   AST (SGOT) Piccolo, Waived  Date Value Ref Range Status  02/06/2018 31 11 - 38 U/L Final         Passed - ALT in normal range and within 360 days    ALT  Date Value Ref Range Status  03/06/2024 29 0 - 44  IU/L Final   ALT (SGPT) Piccolo, Waived  Date Value Ref Range Status  02/06/2018 36 10 - 47 U/L Final         Passed - Last BP in normal range    BP Readings from Last 1 Encounters:  03/06/24 133/83         Passed - Valid encounter within last 12 months    Recent Outpatient Visits           5 months ago Annual physical exam   [place] [facility] Melvin Pao, NP               dapagliflozin  propanediol (FARXIGA ) 10 MG TABS tablet 90 tablet 1    Sig: Take 1 tablet (10 mg total) by mouth daily before breakfast.     Endocrinology:  Diabetes - SGLT2 Inhibitors Failed - 08/27/2024 10:16 AM      Failed - Cr in normal range and within 360 days  Creatinine  Date Value Ref Range Status  12/01/2013 1.32 (H) 0.60 - 1.30 mg/dL Final   Creatinine, Ser  Date Value Ref Range Status  03/06/2024 1.49 (H) 0.76 - 1.27 mg/dL Final         Failed - eGFR in normal range and within 360 days    EGFR (African American)  Date Value Ref Range Status  12/01/2013 >60  Final   GFR calc Af Amer  Date Value Ref Range Status  01/27/2019 65 >59 mL/min/1.73 Final   EGFR (Non-African Amer.)  Date Value Ref Range Status  12/01/2013 >60  Final    Comment:    eGFR values <41mL/min/1.73 m2 may be an indication of chronic kidney disease (CKD). Calculated eGFR is useful in patients with stable renal function. The eGFR calculation will not be reliable in acutely ill patients when serum creatinine is changing rapidly. It is not useful in  patients on dialysis. The eGFR calculation may not be applicable to patients at the low and high extremes of body sizes, pregnant women, and vegetarians.    GFR calc non Af Amer  Date Value Ref Range Status  01/27/2019 56 (L) >59 mL/min/1.73 Final   eGFR  Date Value Ref Range Status  03/06/2024 52 (L) >59 mL/min/1.73 Final         Passed - HBA1C is between 0 and 7.9 and within 180 days    HB A1C (BAYER DCA - WAIVED)  Date Value  Ref Range Status  01/24/2021 6.3 <7.0 % Final    Comment:                                          Diabetic Adult            <7.0                                       Healthy Adult        4.3 - 5.7                                                           (DCCT/NGSP) American Diabetes Association's Summary of Glycemic Recommendations for Adults with Diabetes: Hemoglobin A1c <7.0%. More stringent glycemic goals (A1c <6.0%) may further reduce complications at the cost of increased risk of hypoglycemia.    Hgb A1c MFr Bld  Date Value Ref Range Status  03/06/2024 6.8 (H) 4.8 - 5.6 % Final    Comment:             Prediabetes: 5.7 - 6.4          Diabetes: >6.4          Glycemic control for adults with diabetes: <7.0          Passed - Valid encounter within last 6 months    Recent Outpatient Visits           5 months ago Annual physical exam   [place] [facility] Melvin Pao, NP               rosuvastatin  (CRESTOR ) 10 MG tablet 90 tablet  1    Sig: Take 1 tablet (10 mg total) by mouth daily.     Cardiovascular:  Antilipid - Statins 2 Failed - 08/27/2024 10:16 AM      Failed - Cr in normal range and within 360 days    Creatinine  Date Value Ref Range Status  12/01/2013 1.32 (H) 0.60 - 1.30 mg/dL Final   Creatinine, Ser  Date Value Ref Range Status  03/06/2024 1.49 (H) 0.76 - 1.27 mg/dL Final         Failed - Lipid Panel in normal range within the last 12 months    Cholesterol, Total  Date Value Ref Range Status  03/06/2024 141 100 - 199 mg/dL Final   Cholesterol Piccolo, Waived  Date Value Ref Range Status  02/06/2018 189 <200 mg/dL Final    Comment:                            Desirable                <200                         Borderline High      200- 239                         High                     >239    LDL Chol Calc (NIH)  Date Value Ref Range Status  03/06/2024 83 0 - 99 mg/dL Final   HDL  Date Value Ref Range Status   03/06/2024 29 (L) >39 mg/dL Final   Triglycerides  Date Value Ref Range Status  03/06/2024 166 (H) 0 - 149 mg/dL Final   Triglycerides Piccolo,Waived  Date Value Ref Range Status  02/06/2018 274 (H) <150 mg/dL Final    Comment:                            Normal                   <150                         Borderline High     150 - 199                         High                200 - 499                         Very High                >499          Passed - Patient is not pregnant      Passed - Valid encounter within last 12 months    Recent Outpatient Visits           5 months ago Annual physical exam   [place] [facility] Melvin Pao, NP

## 2024-08-29 ENCOUNTER — Other Ambulatory Visit: Payer: Self-pay

## 2024-08-29 ENCOUNTER — Other Ambulatory Visit: Payer: Self-pay | Admitting: Nurse Practitioner

## 2024-08-29 DIAGNOSIS — I10 Essential (primary) hypertension: Secondary | ICD-10-CM

## 2024-08-29 MED FILL — Rosuvastatin Calcium Tab 10 MG: ORAL | 90 days supply | Qty: 90 | Fill #0 | Status: CN

## 2024-08-29 MED FILL — Dapagliflozin Propanediol Tab 10 MG (Base Equivalent): ORAL | 90 days supply | Qty: 90 | Fill #0 | Status: CN

## 2024-08-29 MED FILL — Sildenafil Citrate Tab 100 MG: ORAL | 30 days supply | Qty: 5 | Fill #0 | Status: AC

## 2024-08-29 MED FILL — Valsartan Tab 40 MG: ORAL | 90 days supply | Qty: 90 | Fill #0 | Status: CN

## 2024-08-29 NOTE — Telephone Encounter (Signed)
 Requested Prescriptions  Pending Prescriptions Disp Refills   FARXIGA  10 MG TABS tablet [Pharmacy Med Name: dapagliflozin  propanediol (FARXIGA ) 10 MG Tab tablet] 90 tablet 0    Sig: Take 1 tablet (10 mg total) by mouth daily before breakfast.     Endocrinology:  Diabetes - SGLT2 Inhibitors Failed - 08/29/2024  3:06 PM      Failed - Cr in normal range and within 360 days    Creatinine  Date Value Ref Range Status  12/01/2013 1.32 (H) 0.60 - 1.30 mg/dL Final   Creatinine, Ser  Date Value Ref Range Status  03/06/2024 1.49 (H) 0.76 - 1.27 mg/dL Final         Failed - eGFR in normal range and within 360 days    EGFR (African American)  Date Value Ref Range Status  12/01/2013 >60  Final   GFR calc Af Amer  Date Value Ref Range Status  01/27/2019 65 >59 mL/min/1.73 Final   EGFR (Non-African Amer.)  Date Value Ref Range Status  12/01/2013 >60  Final    Comment:    eGFR values <36mL/min/1.73 m2 may be an indication of chronic kidney disease (CKD). Calculated eGFR is useful in patients with stable renal function. The eGFR calculation will not be reliable in acutely ill patients when serum creatinine is changing rapidly. It is not useful in  patients on dialysis. The eGFR calculation may not be applicable to patients at the low and high extremes of body sizes, pregnant women, and vegetarians.    GFR calc non Af Amer  Date Value Ref Range Status  01/27/2019 56 (L) >59 mL/min/1.73 Final   eGFR  Date Value Ref Range Status  03/06/2024 52 (L) >59 mL/min/1.73 Final         Passed - HBA1C is between 0 and 7.9 and within 180 days    HB A1C (BAYER DCA - WAIVED)  Date Value Ref Range Status  01/24/2021 6.3 <7.0 % Final    Comment:                                          Diabetic Adult            <7.0                                       Healthy Adult        4.3 - 5.7                                                           (DCCT/NGSP) American Diabetes Association's Summary of  Glycemic Recommendations for Adults with Diabetes: Hemoglobin A1c <7.0%. More stringent glycemic goals (A1c <6.0%) may further reduce complications at the cost of increased risk of hypoglycemia.    Hgb A1c MFr Bld  Date Value Ref Range Status  03/06/2024 6.8 (H) 4.8 - 5.6 % Final    Comment:             Prediabetes: 5.7 - 6.4          Diabetes: >6.4          Glycemic  control for adults with diabetes: <7.0          Passed - Valid encounter within last 6 months    Recent Outpatient Visits           5 months ago Annual physical exam   [place] [facility] Melvin Pao, NP               rosuvastatin  (CRESTOR ) 10 MG tablet 90 tablet 0    Sig: Take 1 tablet (10 mg total) by mouth daily.     Cardiovascular:  Antilipid - Statins 2 Failed - 08/29/2024  3:06 PM      Failed - Cr in normal range and within 360 days    Creatinine  Date Value Ref Range Status  12/01/2013 1.32 (H) 0.60 - 1.30 mg/dL Final   Creatinine, Ser  Date Value Ref Range Status  03/06/2024 1.49 (H) 0.76 - 1.27 mg/dL Final         Failed - Lipid Panel in normal range within the last 12 months    Cholesterol, Total  Date Value Ref Range Status  03/06/2024 141 100 - 199 mg/dL Final   Cholesterol Piccolo, Waived  Date Value Ref Range Status  02/06/2018 189 <200 mg/dL Final    Comment:                            Desirable                <200                         Borderline High      200- 239                         High                     >239    LDL Chol Calc (NIH)  Date Value Ref Range Status  03/06/2024 83 0 - 99 mg/dL Final   HDL  Date Value Ref Range Status  03/06/2024 29 (L) >39 mg/dL Final   Triglycerides  Date Value Ref Range Status  03/06/2024 166 (H) 0 - 149 mg/dL Final   Triglycerides Piccolo,Waived  Date Value Ref Range Status  02/06/2018 274 (H) <150 mg/dL Final    Comment:                            Normal                   <150                          Borderline High     150 - 199                         High                200 - 499                         Very High                >499          Passed - Patient is not pregnant  Passed - Valid encounter within last 12 months    Recent Outpatient Visits           5 months ago Annual physical exam   [place] [facility] Melvin Pao, NP               sildenafil  (VIAGRA ) 100 MG tablet 5 tablet 7    Sig: Take 0.5-1 tablets (50-100 mg total) by mouth daily as needed for erectile dysfunction.     Urology: Erectile Dysfunction Agents Passed - 08/29/2024  3:06 PM      Passed - AST in normal range and within 360 days    AST  Date Value Ref Range Status  03/06/2024 27 0 - 40 IU/L Final   AST (SGOT) Piccolo, Waived  Date Value Ref Range Status  02/06/2018 31 11 - 38 U/L Final         Passed - ALT in normal range and within 360 days    ALT  Date Value Ref Range Status  03/06/2024 29 0 - 44 IU/L Final   ALT (SGPT) Piccolo, Waived  Date Value Ref Range Status  02/06/2018 36 10 - 47 U/L Final         Passed - Last BP in normal range    BP Readings from Last 1 Encounters:  03/06/24 133/83         Passed - Valid encounter within last 12 months    Recent Outpatient Visits           5 months ago Annual physical exam   [place] [facility] Melvin Pao, NP               valsartan  (DIOVAN ) 40 MG tablet 90 tablet 0    Sig: Take 1 tablet (40 mg total) by mouth daily.     Cardiovascular:  Angiotensin Receptor Blockers Failed - 08/29/2024  3:06 PM      Failed - Cr in normal range and within 180 days    Creatinine  Date Value Ref Range Status  12/01/2013 1.32 (H) 0.60 - 1.30 mg/dL Final   Creatinine, Ser  Date Value Ref Range Status  03/06/2024 1.49 (H) 0.76 - 1.27 mg/dL Final         Passed - K in normal range and within 180 days    Potassium  Date Value Ref Range Status  03/06/2024 4.8 3.5 - 5.2 mmol/L Final   12/01/2013 4.7 3.5 - 5.1 mmol/L Final         Passed - Patient is not pregnant      Passed - Last BP in normal range    BP Readings from Last 1 Encounters:  03/06/24 133/83         Passed - Valid encounter within last 6 months    Recent Outpatient Visits           5 months ago Annual physical exam   [place] [facility] Melvin Pao, NP

## 2024-09-08 ENCOUNTER — Ambulatory Visit: Admitting: Nurse Practitioner

## 2024-09-10 ENCOUNTER — Ambulatory Visit: Admitting: Nurse Practitioner

## 2024-09-10 ENCOUNTER — Encounter: Payer: Self-pay | Admitting: Nurse Practitioner

## 2024-09-10 ENCOUNTER — Other Ambulatory Visit: Payer: Self-pay

## 2024-09-10 VITALS — BP 139/78 | HR 63 | Temp 98.1°F | Ht 72.8 in | Wt 237.6 lb

## 2024-09-10 DIAGNOSIS — E7841 Elevated Lipoprotein(a): Secondary | ICD-10-CM | POA: Diagnosis not present

## 2024-09-10 DIAGNOSIS — Z7984 Long term (current) use of oral hypoglycemic drugs: Secondary | ICD-10-CM

## 2024-09-10 DIAGNOSIS — Z6830 Body mass index (BMI) 30.0-30.9, adult: Secondary | ICD-10-CM

## 2024-09-10 DIAGNOSIS — E118 Type 2 diabetes mellitus with unspecified complications: Secondary | ICD-10-CM | POA: Diagnosis not present

## 2024-09-10 DIAGNOSIS — E119 Type 2 diabetes mellitus without complications: Secondary | ICD-10-CM

## 2024-09-10 DIAGNOSIS — I10 Essential (primary) hypertension: Secondary | ICD-10-CM | POA: Diagnosis not present

## 2024-09-10 LAB — MICROALBUMIN, URINE WAIVED
Creatinine, Urine Waived: 200 mg/dL (ref 10–300)
Microalb, Ur Waived: 30 mg/L — ABNORMAL HIGH (ref 0–19)
Microalb/Creat Ratio: <30 mg/g

## 2024-09-10 MED ORDER — VALSARTAN 40 MG PO TABS
40.0000 mg | ORAL_TABLET | Freq: Every day | ORAL | 1 refills | Status: AC
  Filled 2024-09-10: qty 90, 90d supply, fill #0

## 2024-09-10 MED ORDER — ROSUVASTATIN CALCIUM 10 MG PO TABS
10.0000 mg | ORAL_TABLET | Freq: Every day | ORAL | 1 refills | Status: AC
  Filled 2024-09-10: qty 90, 90d supply, fill #0

## 2024-09-10 MED ORDER — SILDENAFIL CITRATE 100 MG PO TABS
ORAL_TABLET | ORAL | 7 refills | Status: AC
  Filled 2024-09-10: qty 5, fill #0

## 2024-09-10 MED ORDER — DAPAGLIFLOZIN PROPANEDIOL 10 MG PO TABS
10.0000 mg | ORAL_TABLET | Freq: Every day | ORAL | 1 refills | Status: AC
  Filled 2024-09-10: qty 90, 90d supply, fill #0

## 2024-09-10 NOTE — Progress Notes (Signed)
 "  BP 139/78 Comment: Home blood pressure reading  Pulse 63   Temp 98.1 F (36.7 C) (Oral)   Ht 6' 0.8 (1.849 m)   Wt 237 lb 9.6 oz (107.8 kg)   SpO2 94%   BMI 31.52 kg/m    Subjective:    Patient ID: Richard Rodgers, male    DOB: 11-Aug-1960, 64 y.o.   MRN: [id number]  HPI: Richard Rodgers is a 65 y.o. male  Chief Complaint  Patient presents with   Office Visit    6 month F/u   HYPERTENSION / HYPERLIPIDEMIA Satisfied with current treatment? yes Duration of hypertension: years BP monitoring frequency: daily BP range: 125-135/65-75 BP medication side effects: no Past BP meds: valsartan  Duration of hyperlipidemia: years Cholesterol medication side effects: no Cholesterol supplements: none Past cholesterol medications: rosuvastatin  (crestor ) Medication compliance: excellent compliance Aspirin: no Recent stressors: no Recurrent headaches: no Visual changes: no Palpitations: no Dyspnea: no Chest pain: no Lower extremity edema: no Dizzy/lightheaded: no  DIABETES Diet controlled.  Eye exam done at [facility]  Hypoglycemic episodes:no Polydipsia/polyuria: no Visual disturbance: no Chest pain: no Paresthesias: no Glucose Monitoring: no  Accucheck frequency: Not Checking  Fasting glucose:  Post prandial:  Evening:  Before meals: Taking Insulin?: no  Long acting insulin:  Short acting insulin: Blood Pressure Monitoring: daily Retinal Examination: Up to Date Foot Exam: Up to Date Diabetic Education: Not Completed Pneumovax: Up to Date Influenza: Up to Date Aspirin: no  Relevant past medical, surgical, family and social history reviewed and updated as indicated. Interim medical history since our last visit reviewed. Allergies and medications reviewed and updated.  Review of Systems  Eyes:  Negative for visual disturbance.  Respiratory:  Negative for chest tightness and shortness of breath.   Cardiovascular:  Negative for chest pain, palpitations and  leg swelling.  Endocrine: Negative for polydipsia and polyuria.  Neurological:  Negative for dizziness, light-headedness, numbness and headaches.    Per HPI unless specifically indicated above     Objective:    BP 139/78 Comment: Home blood pressure reading  Pulse 63   Temp 98.1 F (36.7 C) (Oral)   Ht 6' 0.8 (1.849 m)   Wt 237 lb 9.6 oz (107.8 kg)   SpO2 94%   BMI 31.52 kg/m   Wt Readings from Last 3 Encounters:  09/10/24 237 lb 9.6 oz (107.8 kg)  03/06/24 224 lb 9.6 oz (101.9 kg)  09/03/23 230 lb 6.4 oz (104.5 kg)    Physical Exam Vitals and nursing note reviewed.  Constitutional:      General: He is not in acute distress.    Appearance: Normal appearance. He is not ill-appearing, toxic-appearing or diaphoretic.  HENT:     Head: Normocephalic.     Right Ear: External ear normal.     Left Ear: External ear normal.     Nose: Nose normal. No congestion or rhinorrhea.     Mouth/Throat:     Mouth: Mucous membranes are moist.  Eyes:     General:        Right eye: No discharge.        Left eye: No discharge.     Extraocular Movements: Extraocular movements intact.     Conjunctiva/sclera: Conjunctivae normal.     Pupils: Pupils are equal, round, and reactive to light.  Cardiovascular:     Rate and Rhythm: Normal rate and regular rhythm.     Heart sounds: No murmur heard. Pulmonary:     Effort:  Pulmonary effort is normal. No respiratory distress.     Breath sounds: Normal breath sounds. No wheezing, rhonchi or rales.  Abdominal:     General: Abdomen is flat. Bowel sounds are normal.  Musculoskeletal:     Cervical back: Normal range of motion and neck supple.  Skin:    General: Skin is warm and dry.     Capillary Refill: Capillary refill takes less than 2 seconds.  Neurological:     General: No focal deficit present.     Mental Status: He is alert and oriented to person, place, and time.  Psychiatric:        Mood and Affect: Mood normal.        Behavior: Behavior  normal.        Thought Content: Thought content normal.        Judgment: Judgment normal.     Results for orders placed or performed in visit on 03/06/24  Microalbumin, Urine Waived   Collection Time: 03/06/24  8:34 AM  Result Value Ref Range   Microalb, Ur Waived 10 0 - 19 mg/L   Creatinine, Urine Waived 100 10 - 300 mg/dL   Microalb/Creat Ratio <30 <30 mg/g  TSH   Collection Time: 03/06/24  8:35 AM  Result Value Ref Range   TSH 3.250 0.450 - 4.500 uIU/mL  PSA   Collection Time: 03/06/24  8:35 AM  Result Value Ref Range   Prostate Specific Ag, Serum 0.2 0.0 - 4.0 ng/mL  Lipid panel   Collection Time: 03/06/24  8:35 AM  Result Value Ref Range   Cholesterol, Total 141 100 - 199 mg/dL   Triglycerides 833 (H) 0 - 149 mg/dL   HDL 29 (L) >60 mg/dL   VLDL Cholesterol Cal 29 5 - 40 mg/dL   LDL Chol Calc (NIH) 83 0 - 99 mg/dL   Chol/HDL Ratio 4.9 0.0 - 5.0 ratio  CBC with Differential/Platelet   Collection Time: 03/06/24  8:35 AM  Result Value Ref Range   WBC 5.4 3.4 - 10.8 x10E3/uL   RBC 4.29 4.14 - 5.80 x10E6/uL   Hemoglobin 13.2 13.0 - 17.7 g/dL   Hematocrit 59.2 62.4 - 51.0 %   MCV 95 79 - 97 fL   MCH 30.8 26.6 - 33.0 pg   MCHC 32.4 31.5 - 35.7 g/dL   RDW 85.4 88.3 - 84.5 %   Platelets 190 150 - 450 x10E3/uL   Neutrophils 55 Not Estab. %   Lymphs 35 Not Estab. %   Monocytes 7 Not Estab. %   Eos 3 Not Estab. %   Basos 0 Not Estab. %   Neutrophils Absolute 2.9 1.4 - 7.0 x10E3/uL   Lymphocytes Absolute 1.9 0.7 - 3.1 x10E3/uL   Monocytes Absolute 0.4 0.1 - 0.9 x10E3/uL   EOS (ABSOLUTE) 0.2 0.0 - 0.4 x10E3/uL   Basophils Absolute 0.0 0.0 - 0.2 x10E3/uL   Immature Granulocytes 0 Not Estab. %   Immature Grans (Abs) 0.0 0.0 - 0.1 x10E3/uL  Comprehensive metabolic panel with GFR   Collection Time: 03/06/24  8:35 AM  Result Value Ref Range   Glucose 118 (H) 70 - 99 mg/dL   BUN 16 8 - 27 mg/dL   Creatinine, Ser 8.50 (H) 0.76 - 1.27 mg/dL   eGFR 52 (L) >40 fO/fpw/8.26    BUN/Creatinine Ratio 11 10 - 24   Sodium 141 134 - 144 mmol/L   Potassium 4.8 3.5 - 5.2 mmol/L   Chloride 104 96 - 106 mmol/L  CO2 22 20 - 29 mmol/L   Calcium  9.5 8.6 - 10.2 mg/dL   Total Protein 7.0 6.0 - 8.5 g/dL   Albumin 4.3 3.9 - 4.9 g/dL   Globulin, Total 2.7 1.5 - 4.5 g/dL   Bilirubin Total <9.7 0.0 - 1.2 mg/dL   Alkaline Phosphatase 101 44 - 121 IU/L   AST 27 0 - 40 IU/L   ALT 29 0 - 44 IU/L  Hemoglobin A1c   Collection Time: 03/06/24  8:35 AM  Result Value Ref Range   Hgb A1c MFr Bld 6.8 (H) 4.8 - 5.6 %   Est. average glucose Bld gHb Est-mCnc 148 mg/dL      Assessment & Plan:   Problem List Items Addressed This Visit       Cardiovascular and Mediastinum   Primary hypertension   Chronic.  Controlled.  Continue with current medication regimen of Valsartan  40mg  daily.  Refills sent today.  Continue to check blood pressures at home. Running 120-130/80.  Labs ordered today.  Return to clinic in 6 months for reevaluation.  Call sooner if concerns arise.       Relevant Medications   rosuvastatin  (CRESTOR ) 10 MG tablet   sildenafil  (VIAGRA ) 100 MG tablet   valsartan  (DIOVAN ) 40 MG tablet     Endocrine   Type 2 diabetes with complication (HCC) - Primary   Chronic.  Controlled with Farxiga .  Microalbumin up to date.  Last A1c was 6.8%.  Eye exam requested from [facility].  Labs ordered today.  Return to clinic in 6 months for reevaluation.  Call sooner if concerns arise.       Relevant Medications   dapagliflozin  propanediol (FARXIGA ) 10 MG TABS tablet   rosuvastatin  (CRESTOR ) 10 MG tablet   valsartan  (DIOVAN ) 40 MG tablet   Other Relevant Orders   Comprehensive metabolic panel with GFR   Hemoglobin A1c   Microalbumin, Urine Waived   Diabetes mellitus treated with oral medication (HCC)   Relevant Medications   dapagliflozin  propanediol (FARXIGA ) 10 MG TABS tablet   rosuvastatin  (CRESTOR ) 10 MG tablet   valsartan  (DIOVAN ) 40 MG tablet     Other   Hyperlipidemia    Chronic.  Continue Crestor  10mg .  Reviewed with patient during visit today.  Medication sent in to the pharmacy.  Labs ordered today.  Return to clinic in 6 months for reevaluation.  Call sooner if concerns arise.       Relevant Medications   rosuvastatin  (CRESTOR ) 10 MG tablet   sildenafil  (VIAGRA ) 100 MG tablet   valsartan  (DIOVAN ) 40 MG tablet   Other Relevant Orders   Lipid panel   BMI 30.0-30.9,adult   Recommended eating smaller high protein, low fat meals more frequently and exercising 30 mins a day 5 times a week with a goal of 10-15lb weight loss in the next 3 months.         Follow up plan: Return in about 6 months (around 03/10/2025) for Physical and Fasting labs.      "

## 2024-09-10 NOTE — Assessment & Plan Note (Signed)
 Chronic.  Continue Crestor 10mg .  Reviewed with patient during visit today.  Medication sent in to the pharmacy.  Labs ordered today.  Return to clinic in 6 months for reevaluation.  Call sooner if concerns arise.

## 2024-09-10 NOTE — Assessment & Plan Note (Signed)
 Chronic.  Controlled.  Continue with current medication regimen of Valsartan  40mg  daily.  Refills sent today.  Continue to check blood pressures at home. Running 120-130/80.  Labs ordered today.  Return to clinic in 6 months for reevaluation.  Call sooner if concerns arise.

## 2024-09-10 NOTE — Assessment & Plan Note (Signed)
 Recommended eating smaller high protein, low fat meals more frequently and exercising 30 mins a day 5 times a week with a goal of 10-15lb weight loss in the next 3 months.

## 2024-09-10 NOTE — Assessment & Plan Note (Signed)
 Chronic.  Controlled with Farxiga .  Microalbumin up to date.  Last A1c was 6.8%.  Eye exam requested from [facility].  Labs ordered today.  Return to clinic in 6 months for reevaluation.  Call sooner if concerns arise.

## 2024-09-11 ENCOUNTER — Other Ambulatory Visit: Payer: Self-pay

## 2024-09-11 LAB — COMPREHENSIVE METABOLIC PANEL WITH GFR

## 2024-09-11 LAB — LIPID PANEL

## 2024-09-11 LAB — HEMOGLOBIN A1C
Est. average glucose Bld gHb Est-mCnc: 160 mg/dL
Hgb A1c MFr Bld: 7.2 % — ABNORMAL HIGH (ref 4.8–5.6)

## 2025-03-11 ENCOUNTER — Encounter: Admitting: Nurse Practitioner
